# Patient Record
Sex: Female | Born: 1980 | Race: White | Hispanic: No | Marital: Married | Smoking: Former smoker
Health system: Southern US, Community
[De-identification: ages and names within clinical notes are randomized; demographics above are authoritative.]

## PROBLEM LIST (undated history)

## (undated) DIAGNOSIS — J45909 Unspecified asthma, uncomplicated: Secondary | ICD-10-CM

## (undated) DIAGNOSIS — J449 Chronic obstructive pulmonary disease, unspecified: Secondary | ICD-10-CM

## (undated) HISTORY — DX: Unspecified asthma, uncomplicated: J45.909

## (undated) HISTORY — PX: NO PAST SURGERIES: SHX2092

## (undated) HISTORY — DX: Chronic obstructive pulmonary disease, unspecified: J44.9

---

## 2001-05-07 ENCOUNTER — Ambulatory Visit (HOSPITAL_COMMUNITY): Admission: RE | Admit: 2001-05-07 | Discharge: 2001-05-07 | Payer: Self-pay | Admitting: *Deleted

## 2001-08-05 ENCOUNTER — Encounter: Payer: Self-pay | Admitting: *Deleted

## 2001-08-05 ENCOUNTER — Encounter (HOSPITAL_COMMUNITY): Admission: RE | Admit: 2001-08-05 | Discharge: 2001-08-09 | Payer: Self-pay | Admitting: *Deleted

## 2001-08-11 ENCOUNTER — Inpatient Hospital Stay (HOSPITAL_COMMUNITY): Admission: AD | Admit: 2001-08-11 | Discharge: 2001-08-14 | Payer: Self-pay | Admitting: *Deleted

## 2001-08-13 ENCOUNTER — Encounter: Payer: Self-pay | Admitting: *Deleted

## 2002-02-22 ENCOUNTER — Encounter: Admission: RE | Admit: 2002-02-22 | Discharge: 2002-02-22 | Payer: Self-pay | Admitting: *Deleted

## 2004-12-29 ENCOUNTER — Emergency Department: Payer: Self-pay | Admitting: Emergency Medicine

## 2005-09-28 ENCOUNTER — Emergency Department: Payer: Self-pay | Admitting: Emergency Medicine

## 2006-08-03 ENCOUNTER — Emergency Department: Payer: Self-pay

## 2007-02-22 ENCOUNTER — Emergency Department: Payer: Self-pay | Admitting: Internal Medicine

## 2007-07-26 ENCOUNTER — Emergency Department: Payer: Self-pay | Admitting: Emergency Medicine

## 2007-09-22 ENCOUNTER — Emergency Department: Payer: Self-pay | Admitting: Emergency Medicine

## 2008-02-13 ENCOUNTER — Emergency Department: Payer: Self-pay | Admitting: Emergency Medicine

## 2008-02-13 ENCOUNTER — Other Ambulatory Visit: Payer: Self-pay

## 2008-05-10 ENCOUNTER — Emergency Department: Payer: Self-pay | Admitting: Emergency Medicine

## 2008-07-20 ENCOUNTER — Emergency Department: Payer: Self-pay | Admitting: Internal Medicine

## 2008-11-22 ENCOUNTER — Emergency Department: Payer: Self-pay | Admitting: Unknown Physician Specialty

## 2009-05-11 IMAGING — CR DG CHEST 1V PORT
1 series · 1 of 1 positions shown · non-contrast
Comparison: none

REASON FOR EXAM: pain
COMMENTS:

[view not recorded]
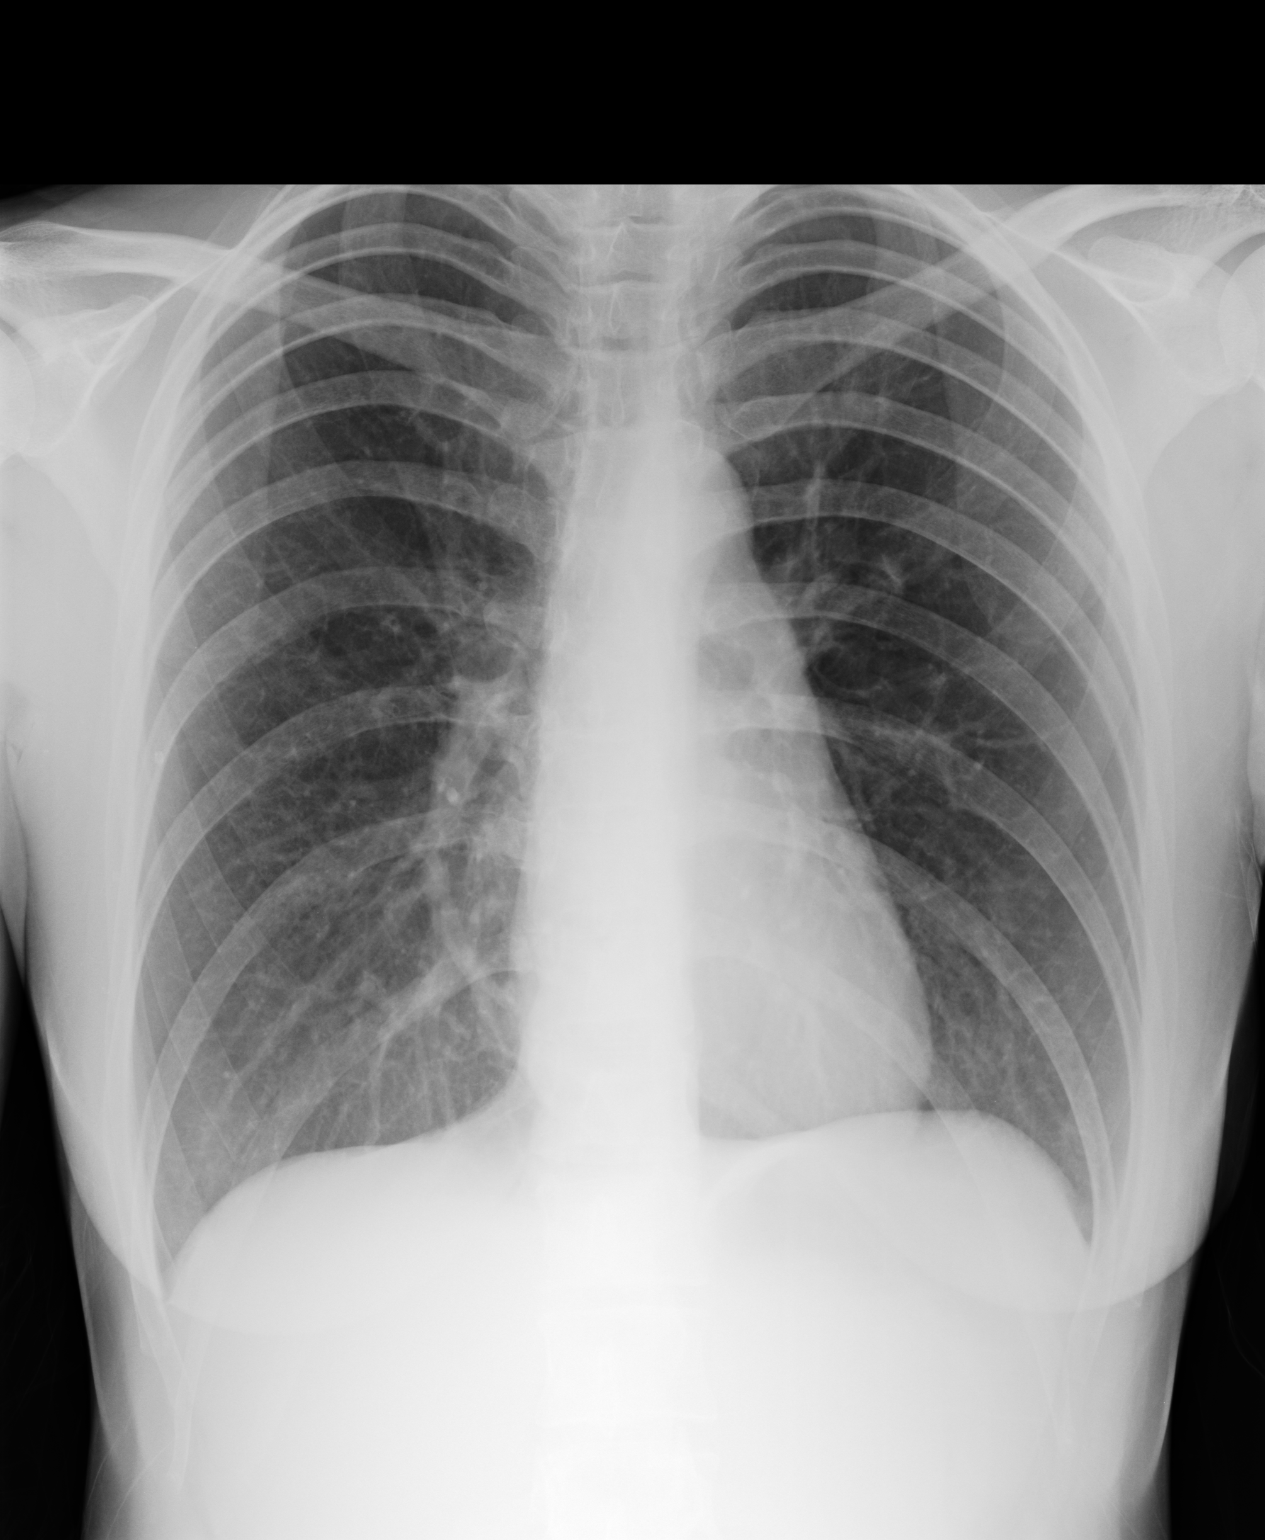

[1 of 1 positions shown; findings below may reference images not displayed]

PROCEDURE:     DXR - DXR PORTABLE CHEST SINGLE VIEW  - September 22, 2007  [DATE]

RESULT:     Comparison is made to a study 03 August, 2006.

The lungs are mildly hyperinflated. There is no focal infiltrate. The heart
and pulmonary vascularity are within the limits of normal. There is no
pleural effusion.
IMPRESSION: There is mild hyperinflation which may reflect an element
of reactive airway disease. I do not see evidence of pneumonia. A follow-up
PA and lateral chest x-ray following therapy would be of value.

## 2009-10-02 IMAGING — CR DG CHEST 1V PORT
1 series · 1 of 1 positions shown · non-contrast
Comparison: none

REASON FOR EXAM: Chest Pain
COMMENTS:

[view not recorded]
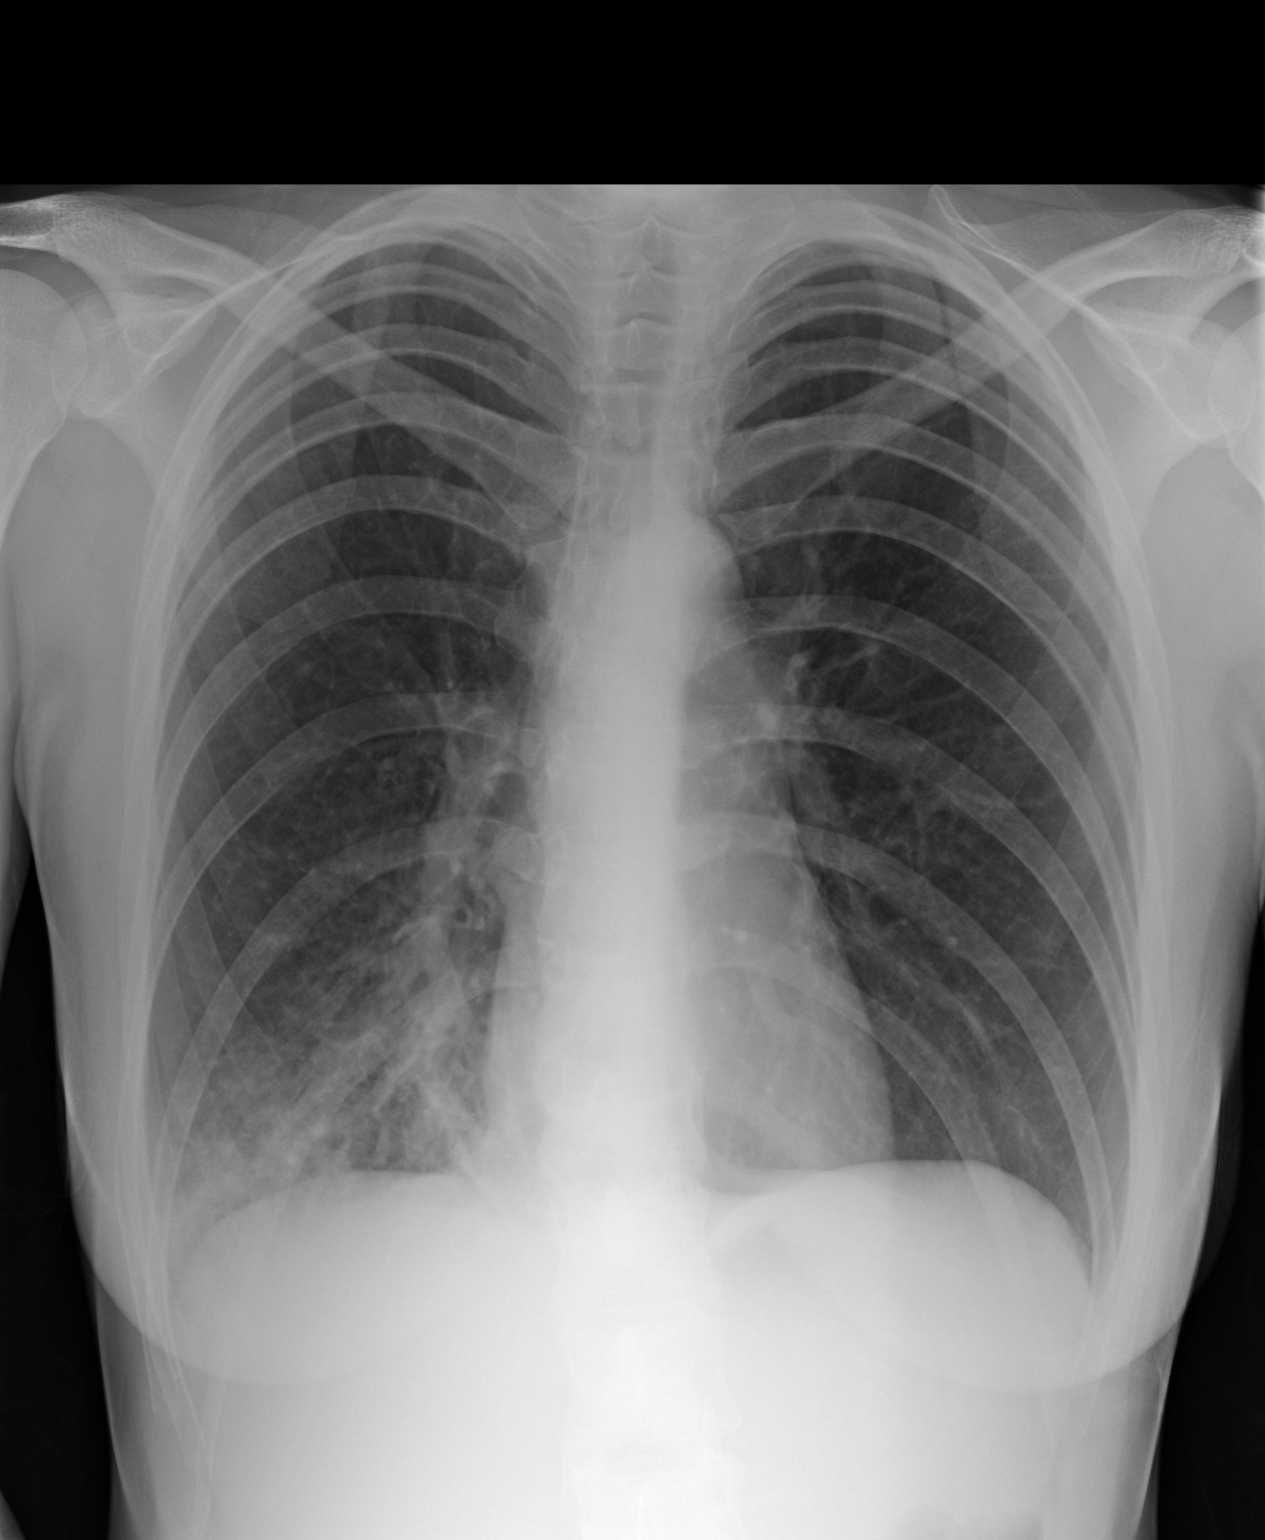

[1 of 1 positions shown; findings below may reference images not displayed]

PROCEDURE:     DXR - DXR PORTABLE CHEST SINGLE VIEW  - February 13, 2008  [DATE]

RESULT:     Comparison is made to the most recent exam performed on
09/22/2007.

There is increased density in the RIGHT lung base consistent with RIGHT
basilar pneumonia, likely in the RIGHT lower lobe. The lungs are mildly
hyperinflated but otherwise clear. The cardiac silhouette and pulmonary
vasculature are normal. There may be a trace RIGHT pleural effusion.
IMPRESSION: RIGHT basilar pneumonia.

## 2009-12-28 IMAGING — CR DG CHEST 2V
1 series · 2 of 2 positions shown · non-contrast
Comparison: none

REASON FOR EXAM: Cough for two months
COMMENTS:

[Series 1: view not recorded · 0.17mm/px · 2 of 2 slices shown]
[im 1/2]
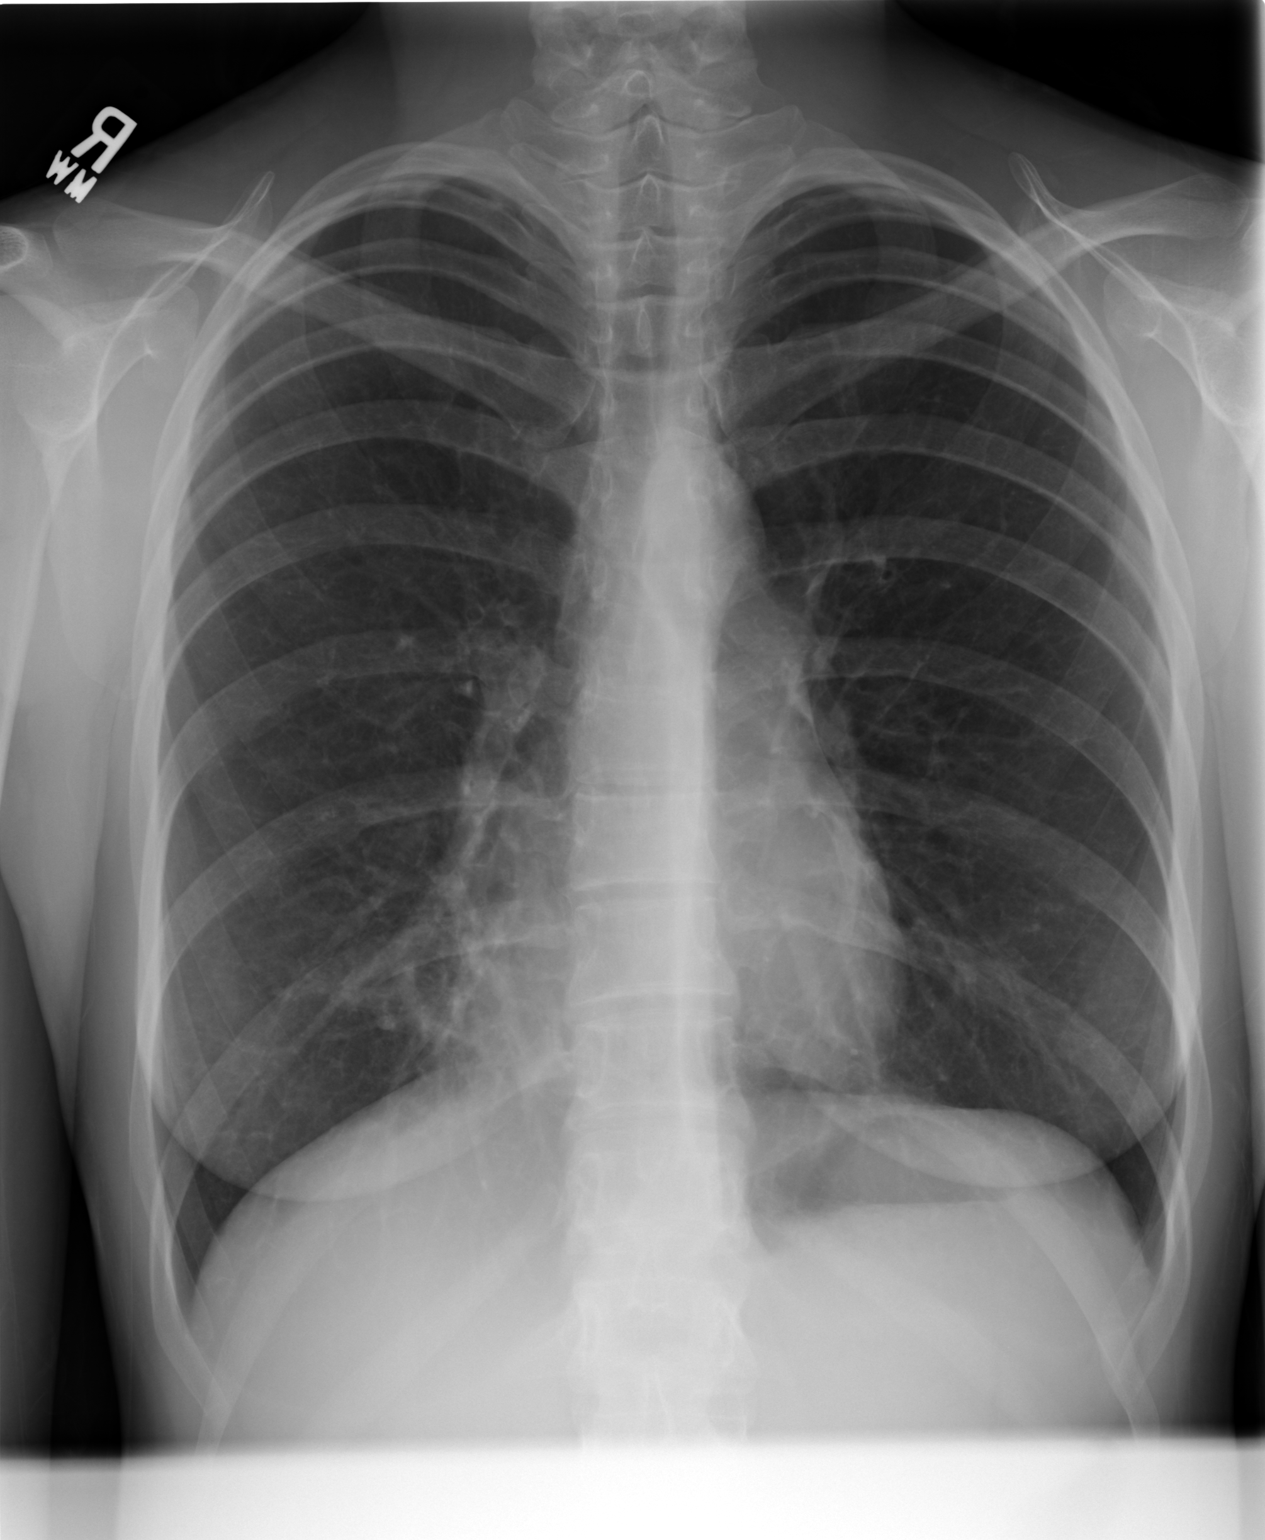
[im 2/2]
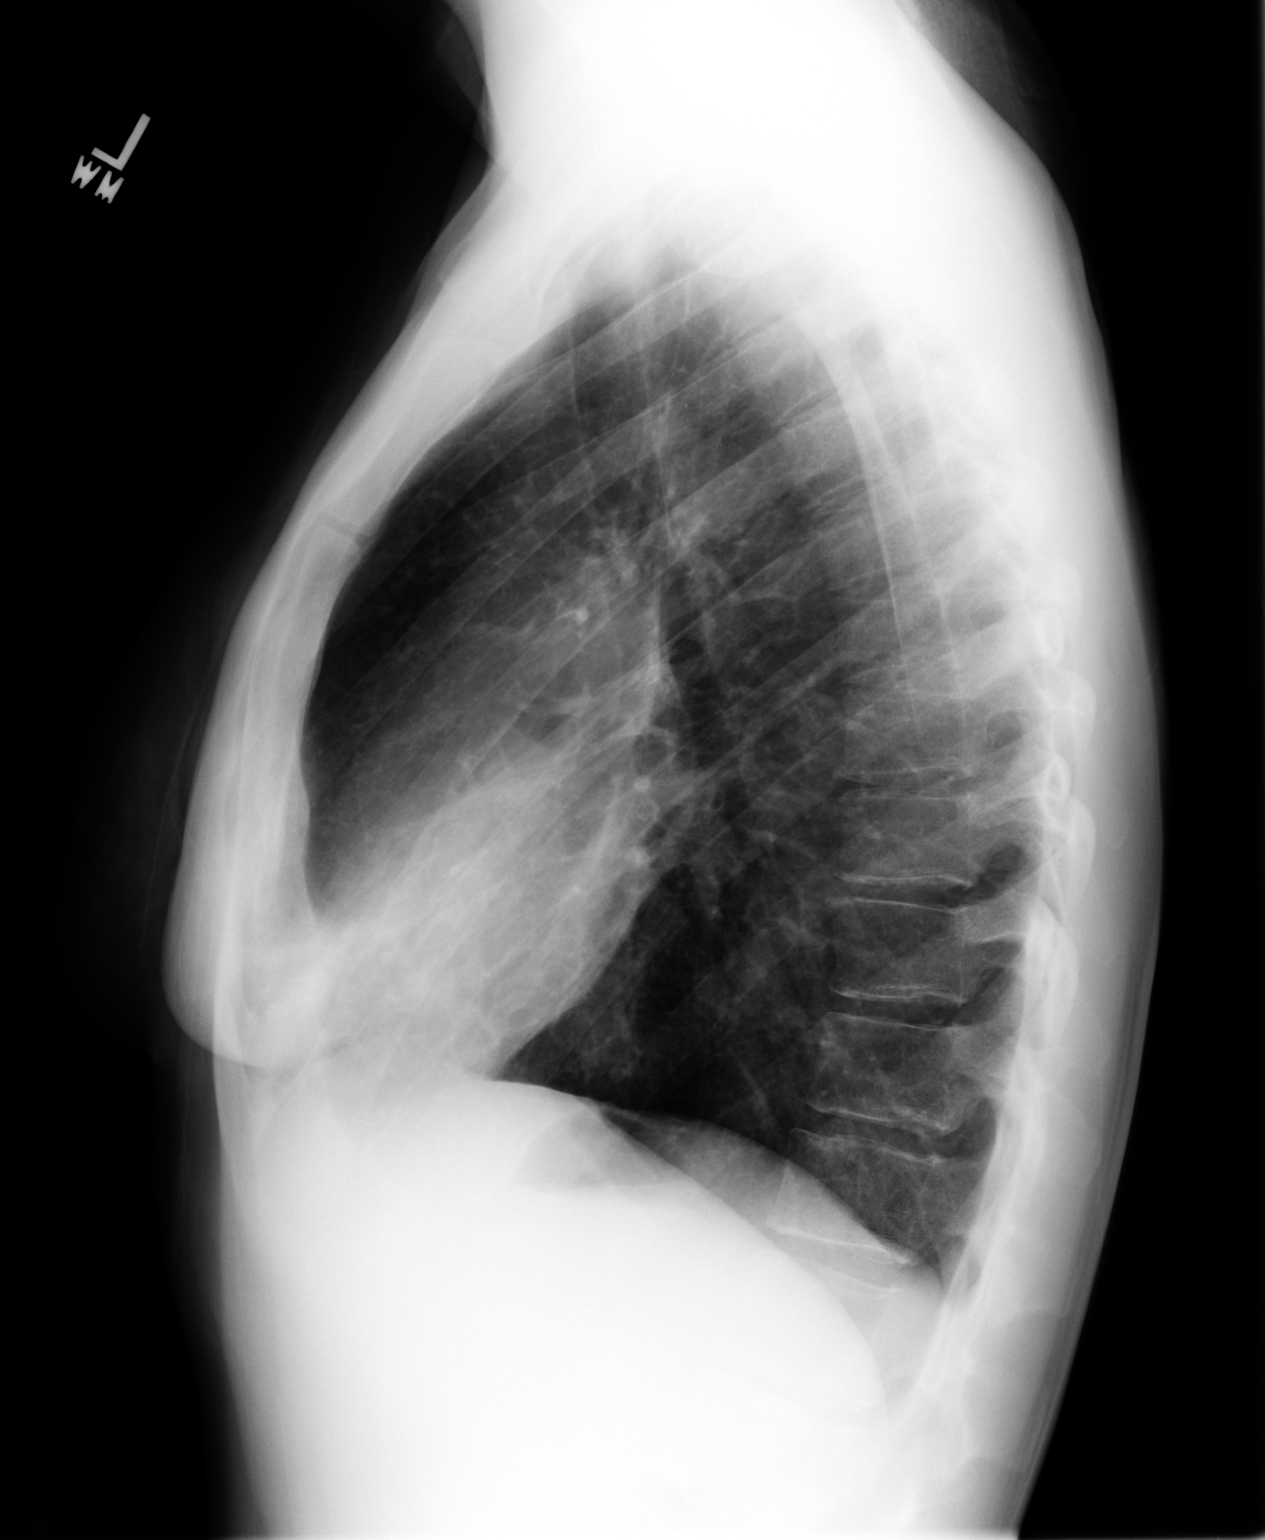

[2 of 2 positions shown; findings below may reference images not displayed]

PROCEDURE:     DXR - DXR CHEST PA (OR AP) AND LATERAL  - May 10, 2008  [DATE]

RESULT:     Comparison is made to a prior study dated 02/13/2008.

There has been decreased conspicuity of the previously described RIGHT lower
lobe density. There does appear to be a residual area of increased density
in the region of the RIGHT middle lobe. No new focal regions of
consolidation or focal infiltrates are appreciated. The cardiac silhouette
and visualized bony skeleton are unremarkable.
IMPRESSION: Findings consistent with resolving RIGHT middle lobe
infiltrate as well as decreased density in the RIGHT lower lobe indicative
of resolution of the RIGHT lower lobe component.

## 2011-03-25 ENCOUNTER — Ambulatory Visit: Payer: Self-pay | Admitting: Family Medicine

## 2011-04-09 ENCOUNTER — Emergency Department: Payer: Self-pay | Admitting: Unknown Physician Specialty

## 2012-11-11 IMAGING — US TRANSABDOMINAL ULTRASOUND OF PELVIS
1 series · 17 of 25 positions shown · non-contrast
Comparison: none

REASON FOR EXAM: dysfunctional bleeding
COMMENTS:

PROCEDURE:     IDIONE - IDIONE PELVIS NON-OB W/TRANSVAGINAL  - March 25, 2011 [DATE]
RESULT:     Comparison: None
TECHNIQUE: Multiple grayscale and color Doppler images obtained of the
pelvis by transabdominal and endovaginal ultrasound.

[Series 1: transabdominal ultrasound of pelvis · 17 of 68 slices shown]
[im 1/68]
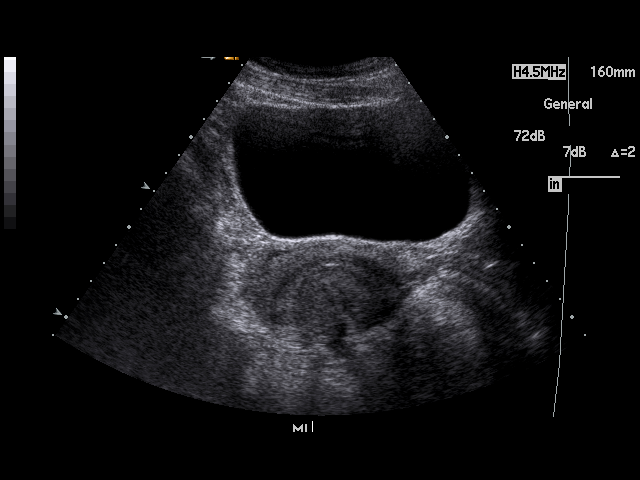
[im 6/68]
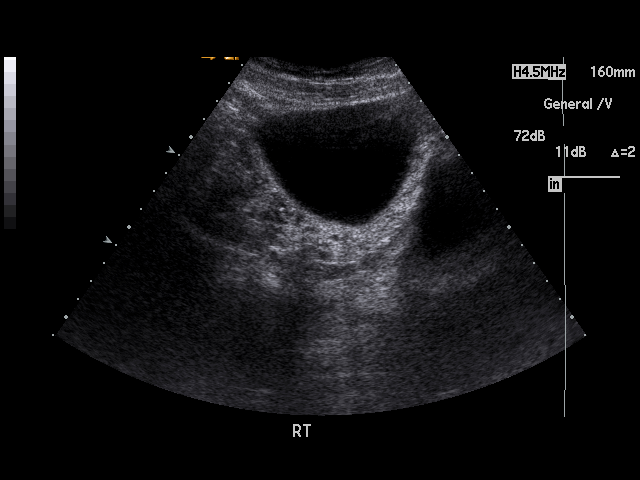
[im 9/68]
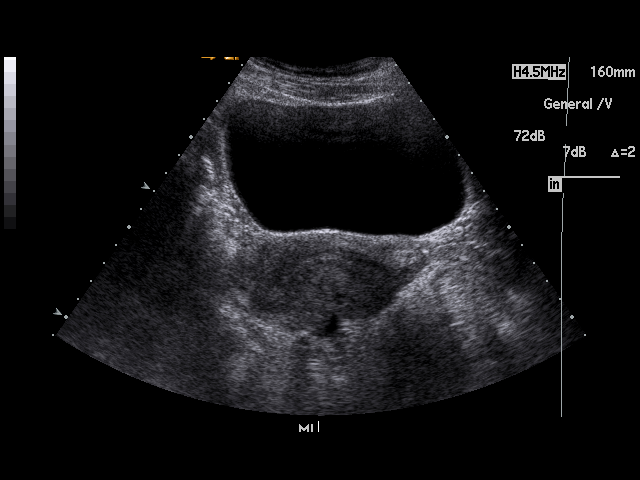
[im 14/68]
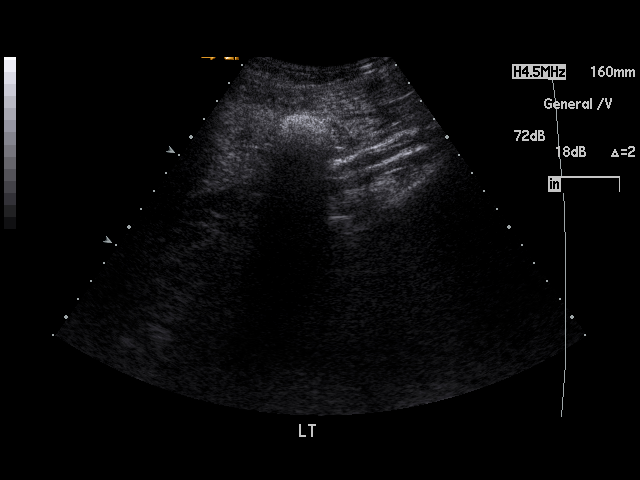
[im 17/68]
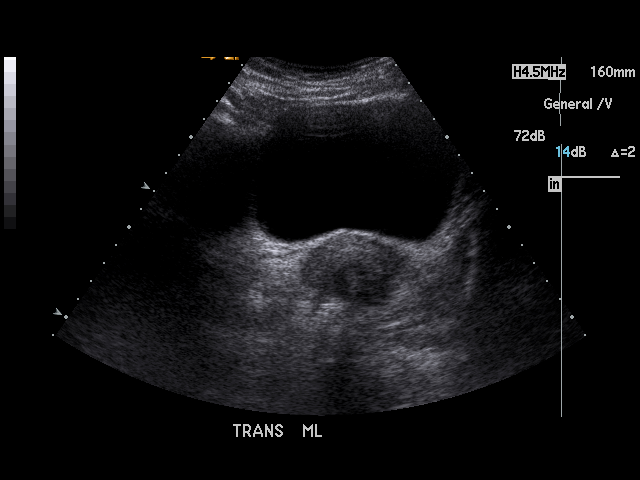
[im 23/68]
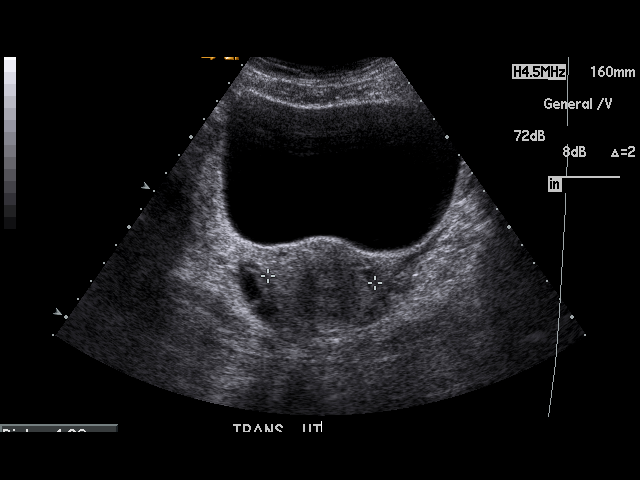
[im 26/68]
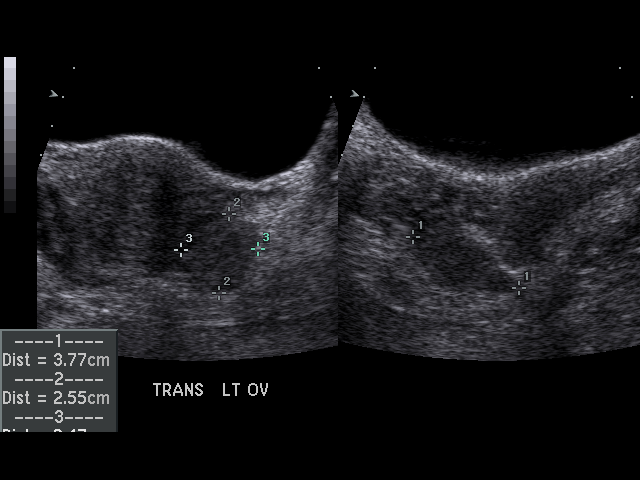
[im 31/68]
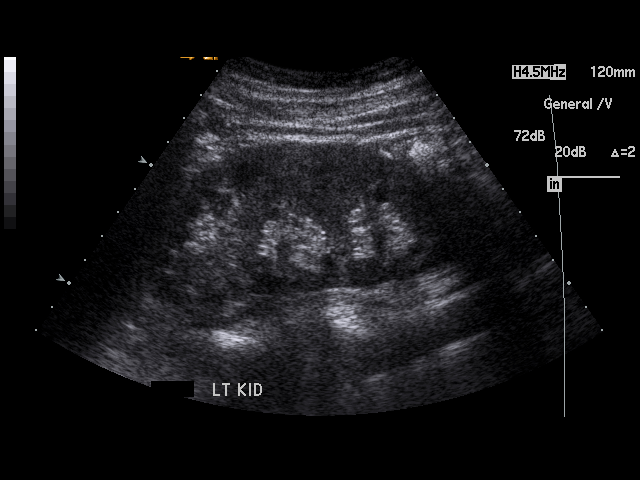
[im 34/68]
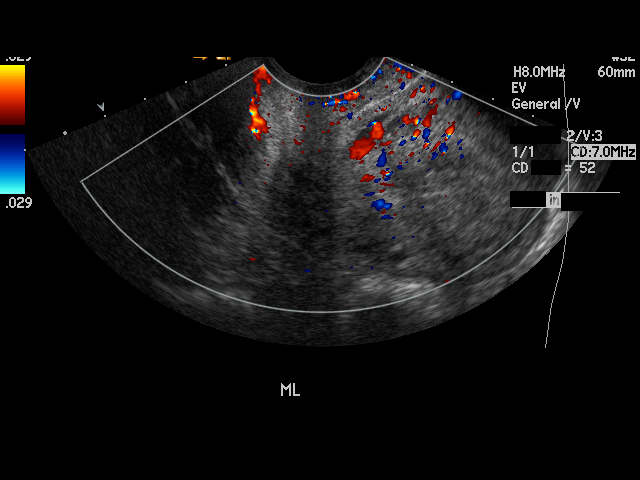
[im 37/68]
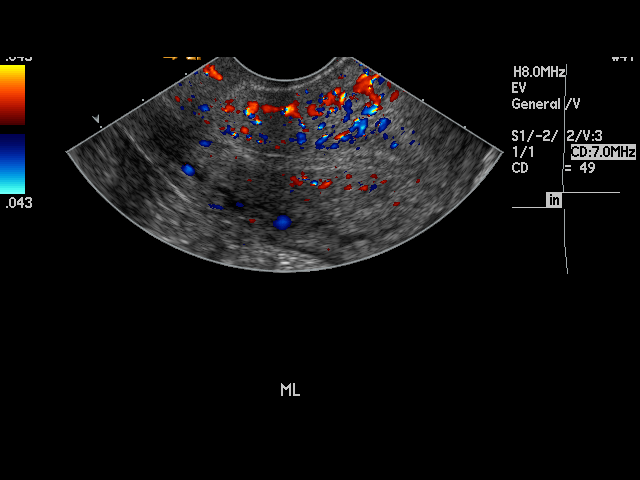
[im 42/68]
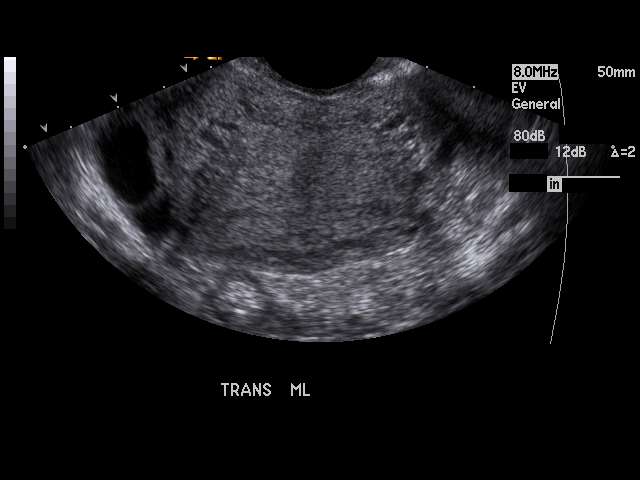
[im 45/68]
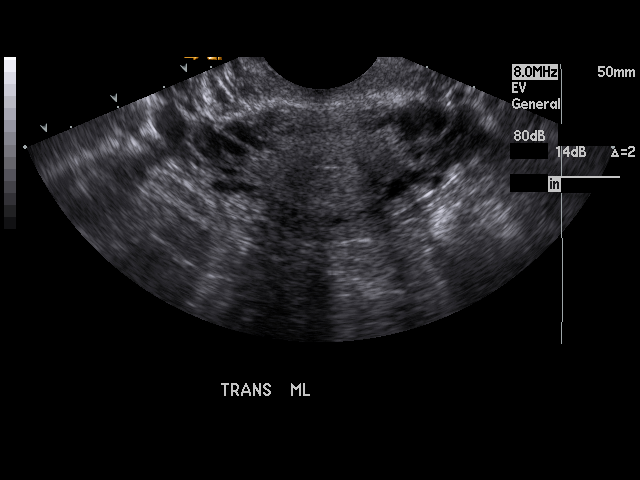
[im 51/68]
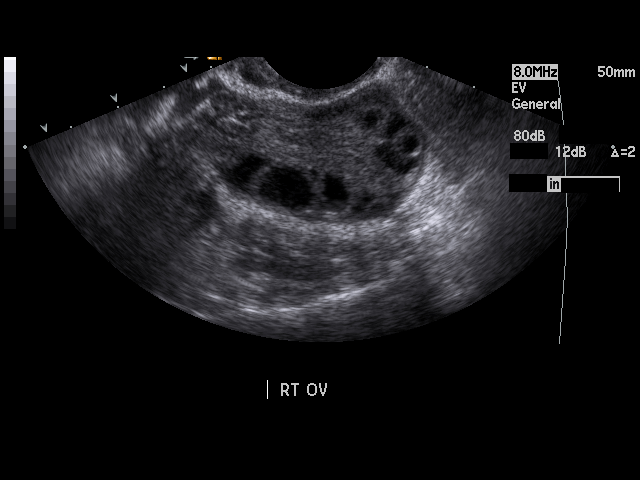
[im 54/68]
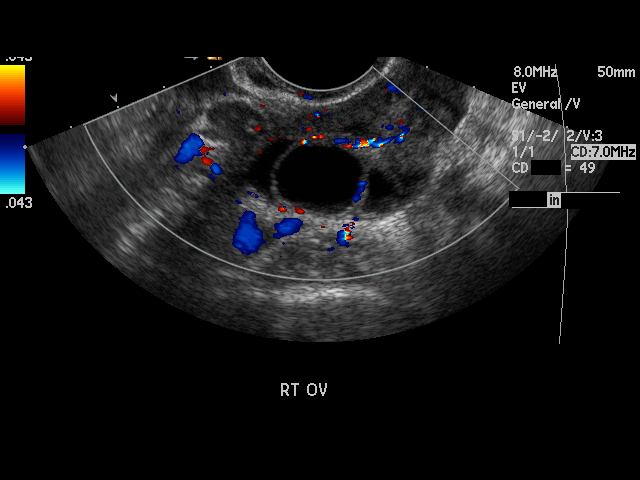
[im 59/68]
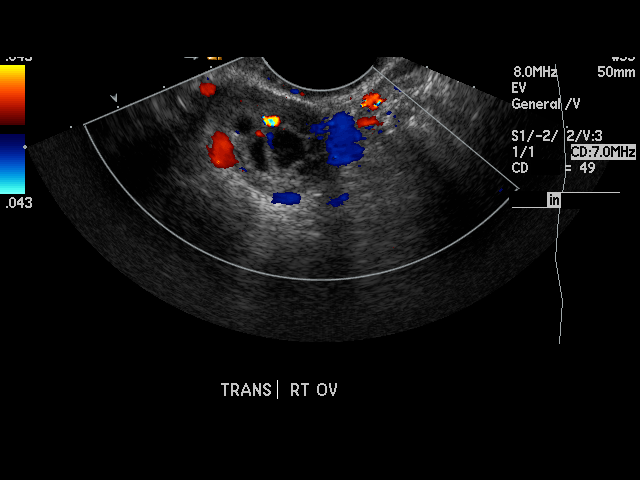
[im 62/68]
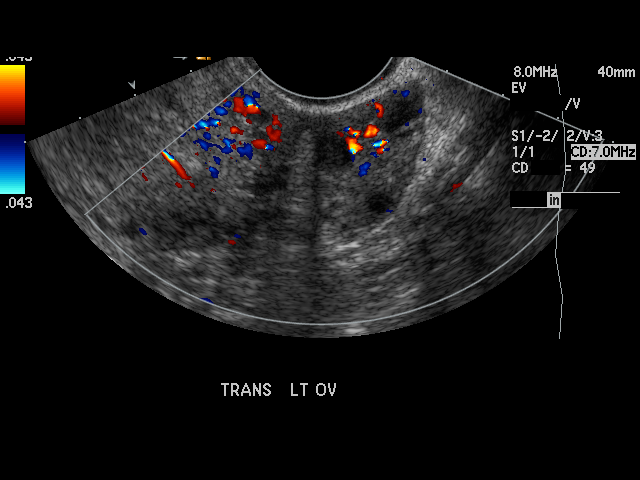
[im 68/68]
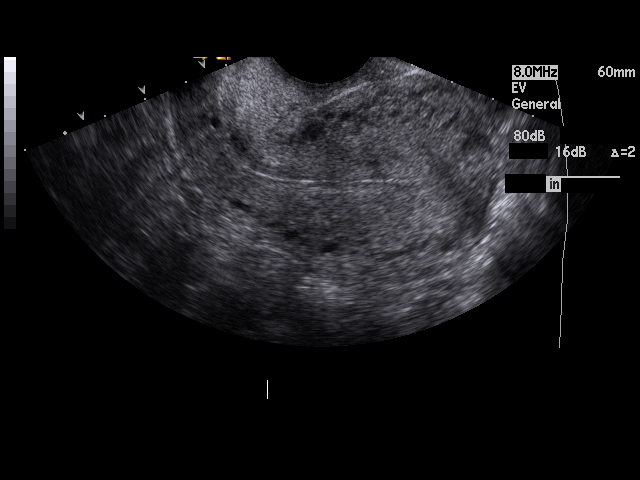

[17 of 25 positions shown; findings below may reference images not displayed]

FINDINGS: The uterus measures 8.6 x 4.3 x 4.9 cm. The stripe measures 7 mm. A trace
amount of fluid is seen in the cul-de-sac, which likely physiologic.

The right ovary measures 4.5 x 3.0 x 2.0 cm. The left ovary measures 3.1 x
2.6 x 1.7 cm. Several small cysts or follicles are seen in the bilateral
ovaries. No adnexal mass seen. Color Doppler flow is associated with the
bilateral ovaries. Spectral Doppler imaging was not performed.
IMPRESSION: Trace amount of fluid in the pelvis, which is likely physiologic. Otherwise,
unremarkable pelvic ultrasound.

## 2012-11-26 IMAGING — CR DG CHEST 2V
1 series · 2 of 2 positions shown · non-contrast
Comparison: none

REASON FOR EXAM: cough   Flex 5
COMMENTS:   LMP: Negative Beta HCG

[Series 1: w chest pa · 0.14mm/px · 2 of 2 slices shown]
[im 1/2]
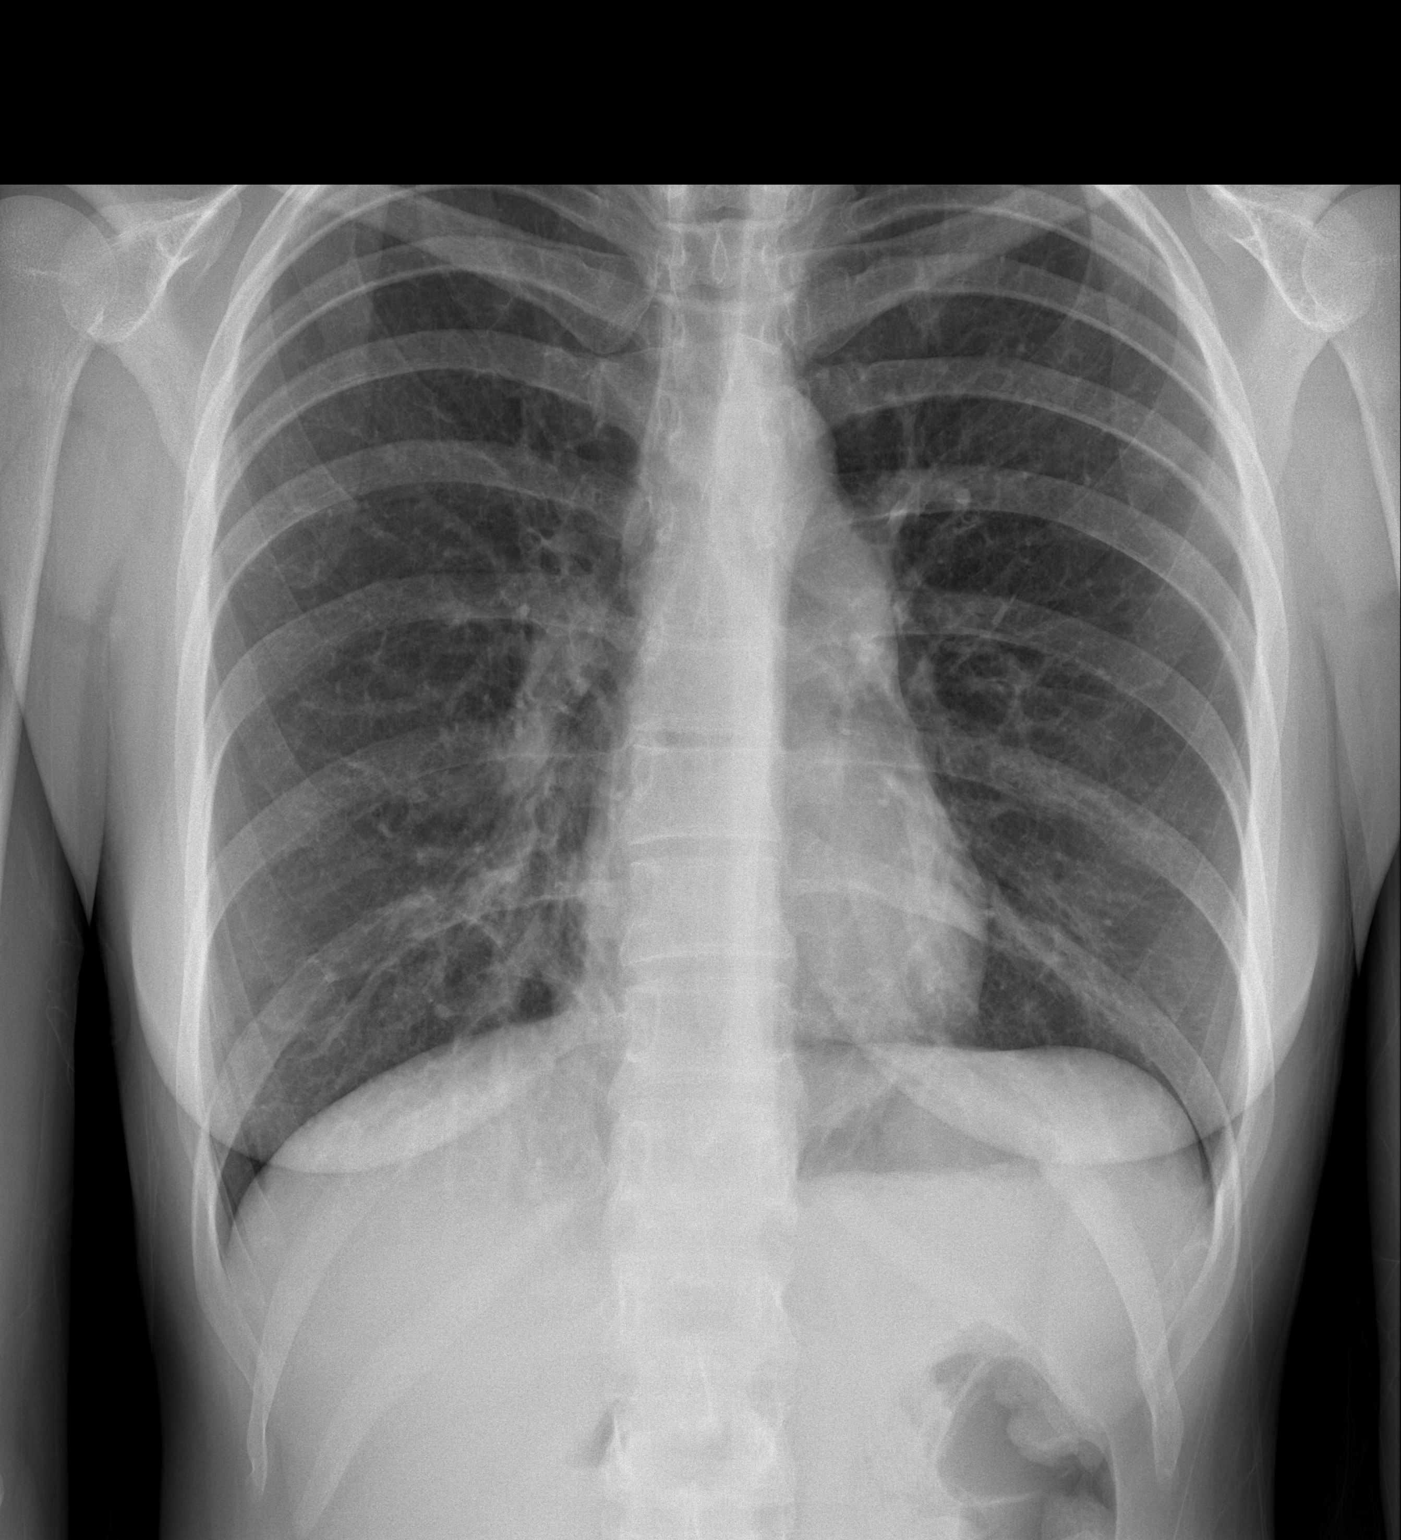
[im 2/2]
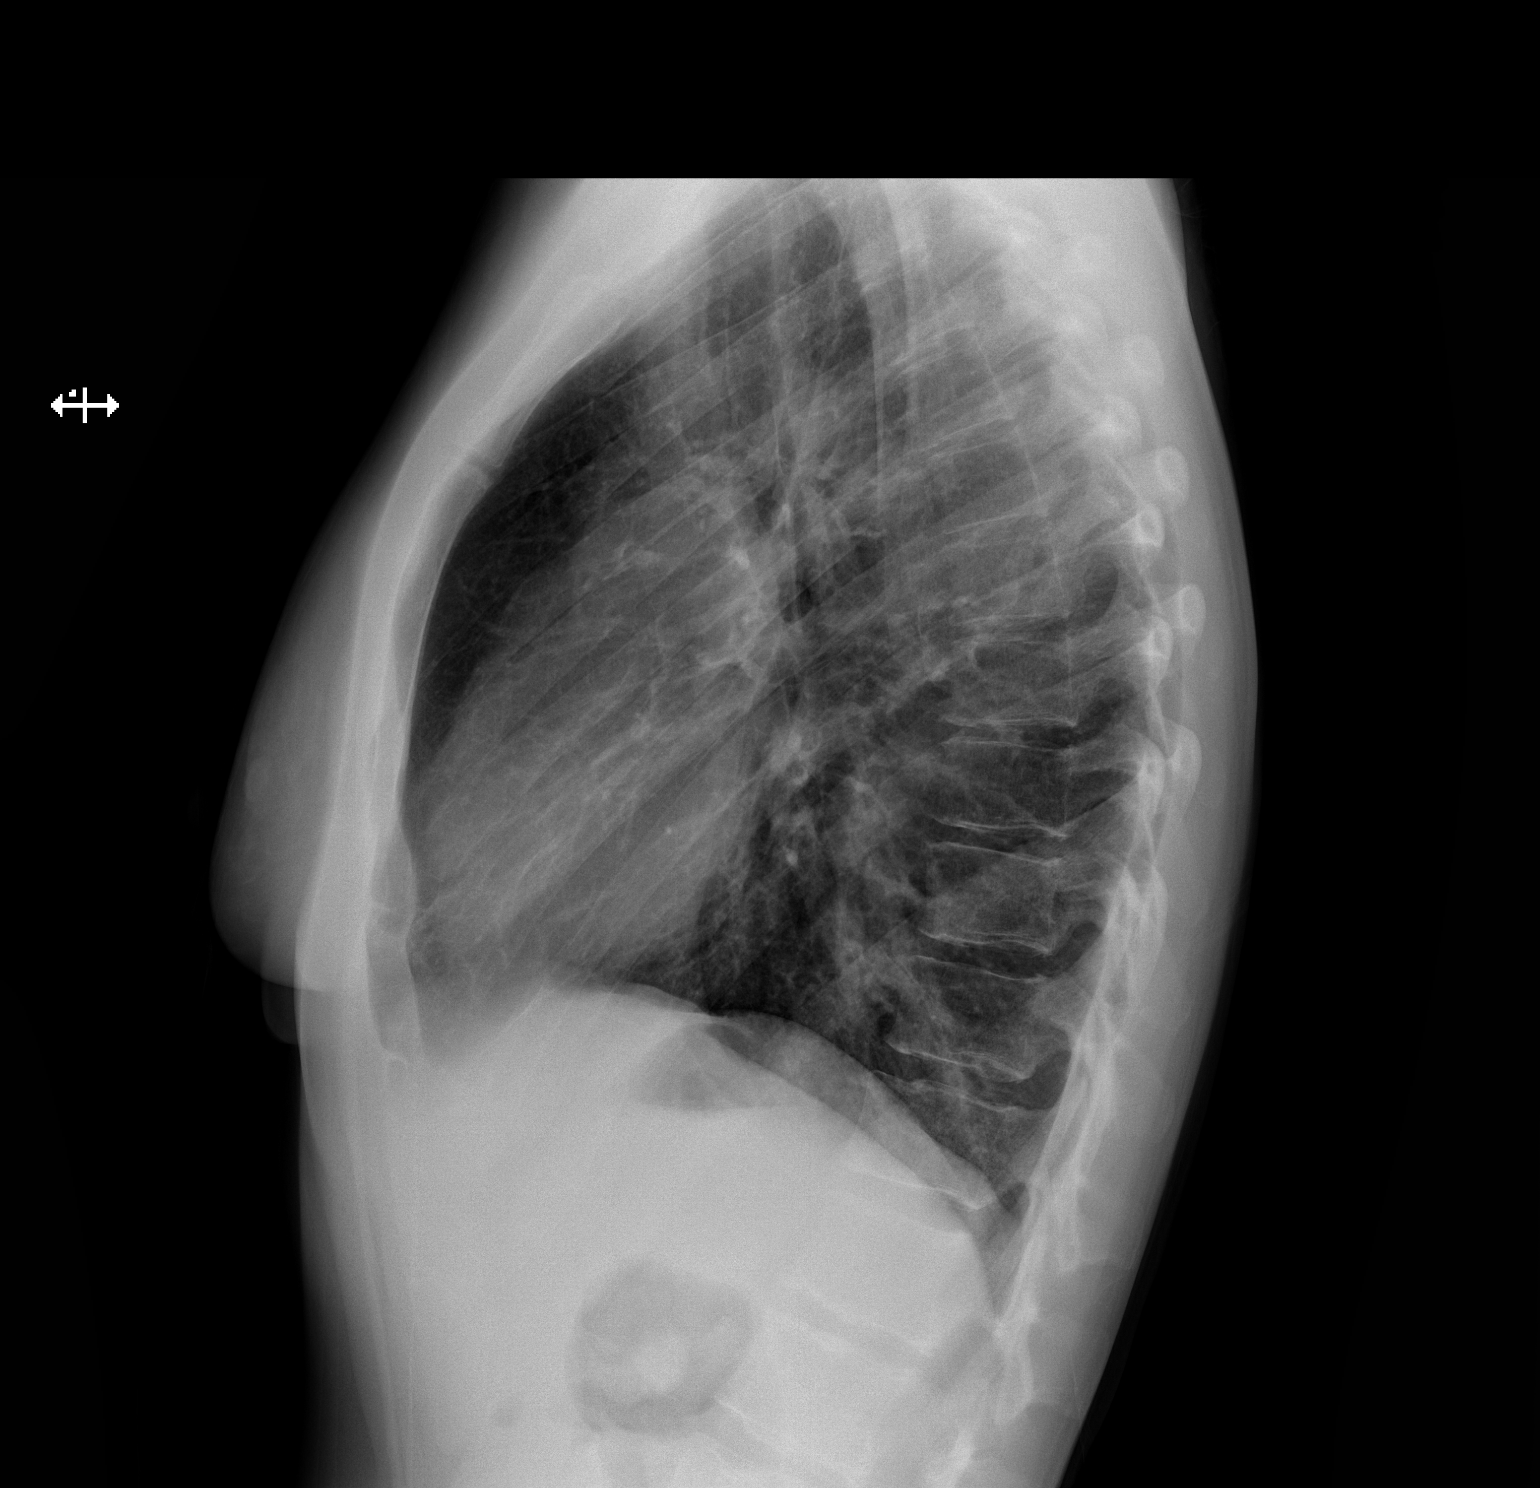

[2 of 2 positions shown; findings below may reference images not displayed]

PROCEDURE:     DXR - DXR CHEST PA (OR AP) AND LATERAL  - April 09, 2011  [DATE]

RESULT:     Comparison is made to the study dated 07/20/2008.

Lungs are fully inflated and clear of infiltrate. There is no effusion,
consolidation or pneumothorax. There is no evidence of a mass. The cardiac
silhouette is normal. The lung markings are coarse. This could be secondary
to technique. Is the patient a smoker?
IMPRESSION: No acute cardiopulmonary disease.

## 2016-12-25 DIAGNOSIS — Z862 Personal history of diseases of the blood and blood-forming organs and certain disorders involving the immune mechanism: Secondary | ICD-10-CM | POA: Diagnosis not present

## 2016-12-25 DIAGNOSIS — E538 Deficiency of other specified B group vitamins: Secondary | ICD-10-CM | POA: Diagnosis not present

## 2016-12-25 DIAGNOSIS — D696 Thrombocytopenia, unspecified: Secondary | ICD-10-CM | POA: Diagnosis not present

## 2017-06-29 DIAGNOSIS — D696 Thrombocytopenia, unspecified: Secondary | ICD-10-CM | POA: Diagnosis not present

## 2017-06-29 DIAGNOSIS — E538 Deficiency of other specified B group vitamins: Secondary | ICD-10-CM | POA: Diagnosis not present

## 2018-01-05 ENCOUNTER — Ambulatory Visit (INDEPENDENT_AMBULATORY_CARE_PROVIDER_SITE_OTHER): Payer: Medicaid Other | Admitting: Cardiology

## 2018-01-05 ENCOUNTER — Encounter: Payer: Self-pay | Admitting: Cardiology

## 2018-01-05 VITALS — BP 110/58 | HR 89 | Ht 67.0 in | Wt 114.0 lb

## 2018-01-05 DIAGNOSIS — R011 Cardiac murmur, unspecified: Secondary | ICD-10-CM

## 2018-01-05 DIAGNOSIS — R002 Palpitations: Secondary | ICD-10-CM

## 2018-01-05 DIAGNOSIS — R9431 Abnormal electrocardiogram [ECG] [EKG]: Secondary | ICD-10-CM

## 2018-01-05 NOTE — Patient Instructions (Signed)
Medication Instructions:  Your physician recommends that you continue on your current medications as directed. Please refer to the Current Medication list given to you today.  Labwork: None   Testing/Procedures: Your physician has requested that you have an echocardiogram. Echocardiography is a painless test that uses sound waves to create images of your heart. It provides your doctor with information about the size and shape of your heart and how well your heart's chambers and valves are working. This procedure takes approximately one hour. There are no restrictions for this procedure.  Your physician has recommended that you wear a holter monitor. Holter monitors are medical devices that record the heart's electrical activity. Doctors most often use these monitors to diagnose arrhythmias. Arrhythmias are problems with the speed or rhythm of the heartbeat. The monitor is a small, portable device. You can wear one while you do your normal daily activities. This is usually used to diagnose what is causing palpitations/syncope (passing out).  Follow-Up: Your physician recommends that you schedule a follow-up appointment in: 3 months   Any Other Special Instructions Will Be Listed Below (If Applicable).     If you need a refill on your cardiac medications before your next appointment, please call your pharmacy.

## 2018-01-05 NOTE — Progress Notes (Signed)
Cardiology Office Note:    Date:  01/05/2018   ID:  Nichole Mullins Fabiano, DOB 07/14/1980, MRN [id number]  PCP:  Retia Passeose, Melissa S, NP  Cardiologist:  Garwin Brothersajan R Hall Birchard, MD   Referring MD: No ref. provider found    ASSESSMENT:    1. Palpitations   2. Abnormal EKG   3. Cardiac murmur    PLAN:    In order of problems listed above:  1. Primary prevention stressed with the patient.  Importance of compliance with diet and medications stressed and she vocalized understanding.  Her palpitations have been on almost daily basis and therefore I would like to do a 48-hour Holter monitor.  If this is unremarkable then in view of her significant symptoms I would like to proceed with a one-month monitoring. 2. Echocardiogram will be done to assess cardiac murmur on auscultation. 3. She will be seen in follow-up appointment in 3 months or earlier if she has any concerns.  Is to go to the nearest emergency room for any concerning symptoms.     Medication Adjustments/Labs and Tests Ordered: Current medicines are reviewed at length with the patient today.  Concerns regarding medicines are outlined above.  Orders Placed This Encounter  Procedures  . Holter monitor - 48 hour  . ECHOCARDIOGRAM COMPLETE   No orders of the defined types were placed in this encounter.    History of Present Illness:    Nichole Mullins Blaney is a 37 y.o. female who is being seen today for the evaluation of palpitations at the request of primary care physician.  Patient is a pleasant 37 year old female.  She mentions to me that since 2014 she has been having palpitations on and off.  These have increased in frequency.  No orthopnea or PND.  No syncopal episodes.  She went to the emergency room for this and was evaluated and released.  Her EKG has revealed short PR interval.  At the time of my evaluation, the patient is alert awake oriented and in no distress.  Her husband accompanies her for this visit.  Past Medical History:  Diagnosis  Date  . Asthma   . COPD (chronic obstructive pulmonary disease) (HCC)     Past Surgical History:  Procedure Laterality Date  . NO PAST SURGERIES      Current Medications: Current Meds  Medication Sig  . hydroxychloroquine (PLAQUENIL) 200 MG tablet Take 1.5 tablets by mouth daily.  . meloxicam (MOBIC) 15 MG tablet Take 1 tablet by mouth daily.  . methocarbamol (ROBAXIN) 500 MG tablet Take 1 tablet by mouth as needed.  . montelukast (SINGULAIR) 10 MG tablet Take 10 mg by mouth daily.  Marland Kitchen. omeprazole (PRILOSEC) 20 MG capsule Take 20 mg by mouth daily.  Marland Kitchen. rOPINIRole (REQUIP) 0.5 MG tablet Take 2 tablets by mouth Nightly.     Allergies:   Patient has no known allergies.   Social History   Socioeconomic History  . Marital status: Married    Spouse name: Not on file  . Number of children: Not on file  . Years of education: Not on file  . Highest education level: Not on file  Occupational History  . Not on file  Social Needs  . Financial resource strain: Not on file  . Food insecurity:    Worry: Not on file    Inability: Not on file  . Transportation needs:    Medical: Not on file    Non-medical: Not on file  Tobacco Use  . Smoking  status: Former Games developer  . Smokeless tobacco: Never Used  Substance and Sexual Activity  . Alcohol use: Not on file  . Drug use: Not on file  . Sexual activity: Not on file  Lifestyle  . Physical activity:    Days per week: Not on file    Minutes per session: Not on file  . Stress: Not on file  Relationships  . Social connections:    Talks on phone: Not on file    Gets together: Not on file    Attends religious service: Not on file    Active member of club or organization: Not on file    Attends meetings of clubs or organizations: Not on file    Relationship status: Not on file  Other Topics Concern  . Not on file  Social History Narrative  . Not on file     Family History: The patient's family history includes Heart disease in her  mother; Lung cancer in her father.  ROS:   Please see the history of present illness.    All other systems reviewed and are negative.  EKGs/Labs/Other Studies Reviewed:    The following studies were reviewed today: I reviewed extensively records from [place] [facility].  Her TSH is also normal.   Recent Labs: No results found for requested labs within last 8760 hours.  Recent Lipid Panel No results found for: CHOL, TRIG, HDL, CHOLHDL, VLDL, LDLCALC, LDLDIRECT  Physical Exam:    VS:  BP (!) 110/58 (BP Location: Right Arm, Patient Position: Sitting, Cuff Size: Normal)   Pulse 89   Ht 5\' 7"  (1.702 m)   Wt 114 lb (51.7 kg)   SpO2 98%   BMI 17.85 kg/m     Wt Readings from Last 3 Encounters:  01/05/18 114 lb (51.7 kg)     GEN: Patient is in no acute distress HEENT: Normal NECK: No JVD; No carotid bruits LYMPHATICS: No lymphadenopathy CARDIAC: S1 S2 regular, 2/6 systolic murmur at the apex. RESPIRATORY:  Clear to auscultation without rales, wheezing or rhonchi  ABDOMEN: Soft, non-tender, non-distended MUSCULOSKELETAL:  No edema; No deformity  SKIN: Warm and dry NEUROLOGIC:  Alert and oriented x 3 PSYCHIATRIC:  Normal affect    Signed, Garwin Brothers, MD  01/05/2018 9:12 AM    [facility]

## 2018-01-21 ENCOUNTER — Other Ambulatory Visit: Payer: Self-pay | Admitting: Cardiology

## 2018-01-21 DIAGNOSIS — R9431 Abnormal electrocardiogram [ECG] [EKG]: Secondary | ICD-10-CM

## 2018-01-21 DIAGNOSIS — R002 Palpitations: Secondary | ICD-10-CM

## 2018-01-25 ENCOUNTER — Ambulatory Visit (INDEPENDENT_AMBULATORY_CARE_PROVIDER_SITE_OTHER): Payer: Medicaid Other

## 2018-01-25 DIAGNOSIS — R9431 Abnormal electrocardiogram [ECG] [EKG]: Secondary | ICD-10-CM | POA: Diagnosis not present

## 2018-01-25 DIAGNOSIS — R002 Palpitations: Secondary | ICD-10-CM

## 2018-02-08 ENCOUNTER — Other Ambulatory Visit: Payer: Self-pay

## 2018-02-08 ENCOUNTER — Ambulatory Visit (INDEPENDENT_AMBULATORY_CARE_PROVIDER_SITE_OTHER): Payer: Medicaid Other

## 2018-02-08 DIAGNOSIS — R9431 Abnormal electrocardiogram [ECG] [EKG]: Secondary | ICD-10-CM

## 2018-02-08 NOTE — Progress Notes (Signed)
Complete echocardiogram has been performed.  Jimmy Marlo Arriola RDCS 

## 2018-02-09 ENCOUNTER — Telehealth: Payer: Self-pay

## 2018-02-09 ENCOUNTER — Telehealth: Payer: Self-pay | Admitting: Cardiology

## 2018-02-09 NOTE — Telephone Encounter (Signed)
Patient was called and notified of results. 

## 2018-02-09 NOTE — Telephone Encounter (Signed)
-----   Message from Rajan R Revankar, MD sent at 02/09/2018  8:21 AM EDT ----- The results of the study is unremarkable. Please inform patient. I will discuss in detail at next appointment. Cc  primary care/referring physician Rajan R Revankar, MD 02/09/2018 8:21 AM 

## 2018-02-09 NOTE — Telephone Encounter (Signed)
Wants to know what kind of therapy she's supposed to take for her PVC's

## 2018-02-10 NOTE — Telephone Encounter (Signed)
Please review and advise.    Thank you.

## 2018-02-10 NOTE — Telephone Encounter (Signed)
Beta blocker was mentioned in the result note will discuss with Dr. Tomie Chinaevankar.

## 2018-02-11 ENCOUNTER — Telehealth: Payer: Self-pay | Admitting: Cardiology

## 2018-02-11 MED ORDER — METOPROLOL SUCCINATE ER 25 MG PO TB24: 25.0000 mg | ORAL_TABLET | Freq: Every day | ORAL | 3 refills | Status: DC

## 2018-02-11 NOTE — Telephone Encounter (Signed)
Returned your call.

## 2018-02-11 NOTE — Telephone Encounter (Signed)
Left VM to return call 

## 2018-02-11 NOTE — Addendum Note (Signed)
Addended by: Rodney LangtonITTER, JADA M on: 02/11/2018 12:02 PM   Modules accepted: Orders

## 2018-02-11 NOTE — Telephone Encounter (Signed)
Pt states that she wants to start the medication to attempt to subside the PVC's.Education provided to pt regarding s.s of hypotension, adverse reactions and monitoring BP. Pt voiced understanding.

## 2018-05-07 ENCOUNTER — Encounter: Payer: Self-pay | Admitting: Cardiology

## 2018-05-07 ENCOUNTER — Telehealth: Payer: Self-pay | Admitting: Cardiology

## 2018-05-07 ENCOUNTER — Ambulatory Visit (INDEPENDENT_AMBULATORY_CARE_PROVIDER_SITE_OTHER): Payer: Medicaid Other | Admitting: Cardiology

## 2018-05-07 VITALS — BP 112/62 | HR 68 | Ht 67.0 in | Wt 115.0 lb

## 2018-05-07 DIAGNOSIS — R011 Cardiac murmur, unspecified: Secondary | ICD-10-CM

## 2018-05-07 DIAGNOSIS — I498 Other specified cardiac arrhythmias: Secondary | ICD-10-CM

## 2018-05-07 DIAGNOSIS — R002 Palpitations: Secondary | ICD-10-CM | POA: Diagnosis not present

## 2018-05-07 DIAGNOSIS — I499 Cardiac arrhythmia, unspecified: Secondary | ICD-10-CM

## 2018-05-07 MED ORDER — NITROGLYCERIN 0.4 MG SL SUBL: 0.4000 mg | SUBLINGUAL_TABLET | SUBLINGUAL | 3 refills | Status: DC | PRN

## 2018-05-07 NOTE — Telephone Encounter (Signed)
Informed the patient that this was sent in incorrectly by another team member and that it has been removed from her chart.

## 2018-05-07 NOTE — Progress Notes (Signed)
Cardiology Office Note:    Date:  05/07/2018   ID:  Nichole Mullins, DOB Mar 02, 1981, MRN [id number]  PCP:  Retia Passe, NP  Cardiologist:  Garwin Brothers, MD   Referring MD: Retia Passe, NP    ASSESSMENT:    1. Ventricular bigeminy   2. Palpitations   3. Cardiac murmur    PLAN:    In order of problems listed above:  1. Primary prevention stressed with the patient.  Importance of compliance with diet and medication stressed and she vocalized understanding.  Her blood pressure is stable.  Diet was discussed and the importance of regular exercise stressed.  Holter monitor and echo report were discussed with her at length. 2. In view of ventricular bigeminy I mentioned to the patient to have a exercise stress echo and she is agreeable. 3. Patient will be seen in follow-up appointment in 6 months or earlier if the patient has any concerns    Medication Adjustments/Labs and Tests Ordered: Current medicines are reviewed at length with the patient today.  Concerns regarding medicines are outlined above.  Orders Placed This Encounter  Procedures  . ECHOCARDIOGRAM STRESS TEST   Meds ordered this encounter  Medications  . DISCONTD: nitroGLYCERIN (NITROSTAT) 0.4 MG SL tablet    Sig: Place 1 tablet (0.4 mg total) under the tongue every 5 (five) minutes as needed for chest pain.    Dispense:  90 tablet    Refill:  3     No chief complaint on file.    History of Present Illness:    Nichole Mullins is a 37 y.o. female.  Patient was evaluated for palpitations.  Her echocardiogram was unremarkable and revealed preserved ejection fraction her Holter monitoring revealed ventricular bigeminy at times.  Since the initiation of beta-blockers patient is doing fine and denies any symptoms and tells me that her palpitations have resolved.  No chest pain orthopnea or PND.  She walks some on a regular basis but not consistently.  At the time of my evaluation, the patient is alert awake  oriented and in no distress.  Past Medical History:  Diagnosis Date  . Asthma   . COPD (chronic obstructive pulmonary disease) (HCC)     Past Surgical History:  Procedure Laterality Date  . NO PAST SURGERIES      Current Medications: Current Meds  Medication Sig  . metoprolol succinate (TOPROL-XL) 25 MG 24 hr tablet Take 1 tablet (25 mg total) by mouth daily. Take with or immediately following a meal.  . montelukast (SINGULAIR) 10 MG tablet Take 10 mg by mouth daily.  Marland Kitchen omeprazole (PRILOSEC) 20 MG capsule Take 20 mg by mouth daily.  Marland Kitchen rOPINIRole (REQUIP) 0.5 MG tablet Take 2 tablets by mouth Nightly.  . vitamin B-12 (CYANOCOBALAMIN) 1000 MCG tablet Take 1,000 mcg by mouth daily.     Allergies:   Ciprofloxacin   Social History   Socioeconomic History  . Marital status: Married    Spouse name: Not on file  . Number of children: Not on file  . Years of education: Not on file  . Highest education level: Not on file  Occupational History  . Not on file  Social Needs  . Financial resource strain: Not on file  . Food insecurity:    Worry: Not on file    Inability: Not on file  . Transportation needs:    Medical: Not on file    Non-medical: Not on file  Tobacco Use  .  Smoking status: Former Games developermoker  . Smokeless tobacco: Never Used  Substance and Sexual Activity  . Alcohol use: Not on file  . Drug use: Not on file  . Sexual activity: Not on file  Lifestyle  . Physical activity:    Days per week: Not on file    Minutes per session: Not on file  . Stress: Not on file  Relationships  . Social connections:    Talks on phone: Not on file    Gets together: Not on file    Attends religious service: Not on file    Active member of club or organization: Not on file    Attends meetings of clubs or organizations: Not on file    Relationship status: Not on file  Other Topics Concern  . Not on file  Social History Narrative  . Not on file     Family History: The  patient's family history includes Heart disease in her mother; Lung cancer in her father.  ROS:   Please see the history of present illness.    All other systems reviewed and are negative.  EKGs/Labs/Other Studies Reviewed:    The following studies were reviewed today: I discussed the findings of the Holter monitor and echo with the patient at extensive length.   Recent Labs: No results found for requested labs within last 8760 hours.  Recent Lipid Panel No results found for: CHOL, TRIG, HDL, CHOLHDL, VLDL, LDLCALC, LDLDIRECT  Physical Exam:    VS:  BP 112/62 (BP Location: Right Arm, Patient Position: Sitting, Cuff Size: Normal)   Pulse 68   Ht 5\' 7"  (1.702 m)   Wt 115 lb (52.2 kg)   SpO2 100%   BMI 18.01 kg/m     Wt Readings from Last 3 Encounters:  05/07/18 115 lb (52.2 kg)  01/05/18 114 lb (51.7 kg)     GEN: Patient is in no acute distress HEENT: Normal NECK: No JVD; No carotid bruits LYMPHATICS: No lymphadenopathy CARDIAC: Hear sounds regular, 2/6 systolic murmur at the apex. RESPIRATORY:  Clear to auscultation without rales, wheezing or rhonchi  ABDOMEN: Soft, non-tender, non-distended MUSCULOSKELETAL:  No edema; No deformity  SKIN: Warm and dry NEUROLOGIC:  Alert and oriented x 3 PSYCHIATRIC:  Normal affect   Signed, Garwin Brothersajan R Edwyna Dangerfield, MD  05/07/2018 9:50 AM    [facility]

## 2018-05-07 NOTE — Telephone Encounter (Signed)
States nitroglycerin was called in for her and she doesn't know why or what its for

## 2018-05-07 NOTE — Patient Instructions (Addendum)
Medication Instructions:  Your physician recommends that you continue on your current medications as directed. Please refer to the Current Medication list given to you today.  If you need a refill on your cardiac medications before your next appointment, please call your pharmacy.   Lab work: None  If you have labs (blood work) drawn today and your tests are completely normal, you will receive your results only by: Marland Kitchen. MyChart Message (if you have MyChart) OR . A paper copy in the mail If you have any lab test that is abnormal or we need to change your treatment, we will call you to review the results.  Testing/Procedures: Your physician has requested that you have a stress echocardiogram. For further information please visit [email or web address]. Please follow instruction sheet as given.  Follow-Up: At [facility], you and your health needs are our priority.  As part of our continuing mission to provide you with exceptional heart care, we have created designated Provider Care Teams.  These Care Teams include your primary Cardiologist (physician) and Advanced Practice Providers (APPs -  Physician Assistants and Nurse Practitioners) who all work together to provide you with the care you need, when you need it.  You will need a follow up appointment in 9 months.  Please call our office 2 months in advance to schedule this appointment.  You may see another member of our [facility] Provider Team in [place]: Gypsy Balsamobert Krasowski, MD . Norman HerrlichBrian Munley, MD  Any Other Special Instructions Will Be Listed Below (If Applicable).

## 2018-05-17 ENCOUNTER — Ambulatory Visit (INDEPENDENT_AMBULATORY_CARE_PROVIDER_SITE_OTHER): Payer: Medicaid Other

## 2018-05-17 ENCOUNTER — Other Ambulatory Visit: Payer: Self-pay | Admitting: Cardiology

## 2018-05-17 DIAGNOSIS — R002 Palpitations: Secondary | ICD-10-CM

## 2018-05-17 DIAGNOSIS — R9439 Abnormal result of other cardiovascular function study: Secondary | ICD-10-CM

## 2018-05-17 DIAGNOSIS — I498 Other specified cardiac arrhythmias: Secondary | ICD-10-CM

## 2018-05-17 DIAGNOSIS — I493 Ventricular premature depolarization: Secondary | ICD-10-CM

## 2018-05-17 DIAGNOSIS — R011 Cardiac murmur, unspecified: Secondary | ICD-10-CM

## 2018-05-17 DIAGNOSIS — I499 Cardiac arrhythmia, unspecified: Secondary | ICD-10-CM

## 2018-05-17 NOTE — Patient Instructions (Addendum)
Medication Instructions:  Your physician recommends that you continue on your current medications as directed. Please refer to the Current Medication list given to you today.  If you need a refill on your cardiac medications before your next appointment, please call your pharmacy.   Lab work: Your physician recommends that you return for lab work today: bmp, bnp   If you have labs (blood work) drawn today and your tests are completely normal, you will receive your results only by: Marland Kitchen MyChart Message (if you have MyChart) OR . A paper copy in the mail If you have any lab test that is abnormal or we need to change your treatment, we will call you to review the results.  Testing/Procedures:   Please arrive at the [facility] main entrance of [facility] at xx:xx AM (30-45 minutes prior to test start time)  [facility] [address] [place], [place] [address] [phone]  Proceed to the [facility] Radiology Department (First Floor).  Please follow these instructions carefully (unless otherwise directed):  Hold all erectile dysfunction medications at least 48 hours prior to test.  On the Night Before the Test: . Be sure to Drink plenty of water. . Do not consume any caffeinated/decaffeinated beverages or chocolate 12 hours prior to your test. . Do not take any antihistamines 12 hours prior to your test. . If you take Metformin do not take 24 hours prior to test.   On the Day of the Test: . Drink plenty of water. Do not drink any water within one hour of the test. . Do not eat any food 4 hours prior to the test. . You may take your regular medications prior to the test.  . Take metoprolol (Lopressor) two hours prior to test. . HOLD Furosemide/Hydrochlorothiazide morning of the test.           -Drink plenty of water       Take your metoprolol as prescribed.                       After the Test: . Drink plenty of water. . After receiving IV  contrast, you may experience a mild flushed feeling. This is normal. . On occasion, you may experience a mild rash up to 24 hours after the test. This is not dangerous. If this occurs, you can take Benadryl 25 mg and increase your fluid intake. . If you experience trouble breathing, this can be serious. If it is severe call 911 IMMEDIATELY. If it is mild, please call our office. . If you take any of these medications: Glipizide/Metformin, Avandament, Glucavance, please do not take 48 hours after completing test.      Follow-Up: At [facility], you and your health needs are our priority.  As part of our continuing mission to provide you with exceptional heart care, we have created designated Provider Care Teams.  These Care Teams include your primary Cardiologist (physician) and Advanced Practice Providers (APPs -  Physician Assistants and Nurse Practitioners) who all work together to provide you with the care you need, when you need it. You will need a follow up appointment in 4 weeks.  Please call our office 2 months in advance to schedule this appointment.  You may see No primary care provider on file. or another member of our [employer] Provider Team in [place]: Gypsy Balsam, MD . Norman Herrlich, MD  Any Other Special Instructions Will Be Listed Below (If Applicable).  Cardiac CT Angiogram A cardiac CT angiogram is a procedure to look at the heart and the area around the heart. It may be done to help find the cause of chest pains or other symptoms of heart disease. During this procedure, a large X-ray machine, called a CT scanner, takes detailed pictures of the heart and the surrounding area after a dye (contrast material) has been injected into blood vessels in the area. The procedure is also sometimes called a coronary CT angiogram, coronary artery scanning, or CTA. A cardiac CT angiogram allows the health care provider to see how well blood is flowing to and from the heart.  The health care provider will be able to see if there are any problems, such as:  Blockage or narrowing of the coronary arteries in the heart.  Fluid around the heart.  Signs of weakness or disease in the muscles, valves, and tissues of the heart.  Tell a health care provider about:  Any allergies you have. This is especially important if you have had a previous allergic reaction to contrast dye.  All medicines you are taking, including vitamins, herbs, eye drops, creams, and over-the-counter medicines.  Any blood disorders you have.  Any surgeries you have had.  Any medical conditions you have.  Whether you are pregnant or may be pregnant.  Any anxiety disorders, chronic pain, or other conditions you have that may increase your stress or prevent you from lying still. What are the risks? Generally, this is a safe procedure. However, problems may occur, including:  Bleeding.  Infection.  Allergic reactions to medicines or dyes.  Damage to other structures or organs.  Kidney damage from the dye or contrast that is used.  Increased risk of cancer from radiation exposure. This risk is low. Talk with your health care provider about: ? The risks and benefits of testing. ? How you can receive the lowest dose of radiation.  What happens before the procedure?  Wear comfortable clothing and remove any jewelry, glasses, dentures, and hearing aids.  Follow instructions from your health care provider about eating and drinking. This may include: ? For 12 hours before the test - avoid caffeine. This includes tea, coffee, soda, energy drinks, and diet pills. Drink plenty of water or other fluids that do not have caffeine in them. Being well-hydrated can prevent complications. ? For 4-6 hours before the test - stop eating and drinking. The contrast dye can cause nausea, but this is less likely if your stomach is empty.  Ask your health care provider about changing or stopping your  regular medicines. This is especially important if you are taking diabetes medicines, blood thinners, or medicines to treat erectile dysfunction. What happens during the procedure?  Hair on your chest may need to be removed so that small sticky patches called electrodes can be placed on your chest. These will transmit information that helps to monitor your heart during the test.  An IV tube will be inserted into one of your veins.  You might be given a medicine to control your heart rate during the test. This will help to ensure that good images are obtained.  You will be asked to lie on an exam table. This table will slide in and out of the CT machine during the procedure.  Contrast dye will be injected into the IV tube. You might feel warm, or you may get a metallic taste in your mouth.  You will be given a medicine (nitroglycerin) to relax (dilate)  the arteries in your heart.  The table that you are lying on will move into the CT machine tunnel for the scan.  The person running the machine will give you instructions while the scans are being done. You may be asked to: ? Keep your arms above your head. ? Hold your breath. ? Stay very still, even if the table is moving.  When the scanning is complete, you will be moved out of the machine.  The IV tube will be removed. The procedure may vary among health care providers and hospitals. What happens after the procedure?  You might feel warm, or you may get a metallic taste in your mouth from the contrast dye.  You may have a headache from the nitroglycerin.  After the procedure, drink water or other fluids to wash (flush) the contrast material out of your body.  Contact a health care provider if you have any symptoms of allergy to the contrast. These symptoms include: ? Shortness of breath. ? Rash or hives. ? A racing heartbeat.  Most people can return to their normal activities right after the procedure. Ask your health care  provider what activities are safe for you.  It is up to you to get the results of your procedure. Ask your health care provider, or the department that is doing the procedure, when your results will be ready. Summary  A cardiac CT angiogram is a procedure to look at the heart and the area around the heart. It may be done to help find the cause of chest pains or other symptoms of heart disease.  During this procedure, a large X-ray machine, called a CT scanner, takes detailed pictures of the heart and the surrounding area after a dye (contrast material) has been injected into blood vessels in the area.  Ask your health care provider about changing or stopping your regular medicines before the procedure. This is especially important if you are taking diabetes medicines, blood thinners, or medicines to treat erectile dysfunction.  After the procedure, drink water or other fluids to wash (flush) the contrast material out of your body. This information is not intended to replace advice given to you by your health care provider. Make sure you discuss any questions you have with your health care provider. Document Released: 05/15/2008 Document Revised: 04/21/2016 Document Reviewed: 04/21/2016 Elsevier Interactive Patient Education  2017 [employer].

## 2018-05-17 NOTE — Progress Notes (Signed)
Stress echocardiogram has been performed.  Jimmy Frazier Balfour RDCS, RVT 

## 2018-05-18 LAB — BASIC METABOLIC PANEL
BUN / CREAT RATIO: 13 (ref 9–23)
BUN: 9 mg/dL (ref 6–20)
CHLORIDE: 104 mmol/L (ref 96–106)
CO2: 22 mmol/L (ref 20–29)
Calcium: 9.3 mg/dL (ref 8.7–10.2)
Creatinine, Ser: 0.71 mg/dL (ref 0.57–1.00)
GFR, EST AFRICAN AMERICAN: 127 mL/min/{1.73_m2} (ref >59)
GFR, EST NON AFRICAN AMERICAN: 110 mL/min/{1.73_m2} (ref >59)
Glucose: 84 mg/dL (ref 65–99)
POTASSIUM: 3.5 mmol/L (ref 3.5–5.2)
SODIUM: 140 mmol/L (ref 134–144)

## 2018-05-18 LAB — PRO B NATRIURETIC PEPTIDE: NT-Pro BNP: 96 pg/mL (ref 0–130)

## 2018-06-17 ENCOUNTER — Ambulatory Visit: Payer: Medicaid Other | Admitting: Cardiology

## 2018-06-25 ENCOUNTER — Telehealth: Payer: Self-pay | Admitting: *Deleted

## 2018-06-25 NOTE — Telephone Encounter (Signed)
Contacted patient to reschedule follow up appointment on 06/28/2018 until after cardiac CTA is completed on 07/08/2018. Patient will see Dr. Tomie China on Friday, 07/09/2018 at 9:20 am in the [place] office to discuss results.   Patient reminded to have lab work drawn prior to caridac CTA. Patient states she will return to the [place] office on Tuesday, 07/06/2018 to have BMP drawn, no appointment needed. Patient verbalized understanding. No further questions.

## 2018-06-28 ENCOUNTER — Ambulatory Visit: Payer: Medicaid Other | Admitting: Cardiology

## 2018-07-06 LAB — BASIC METABOLIC PANEL
BUN/Creatinine Ratio: 13 (ref 9–23)
BUN: 10 mg/dL (ref 6–20)
CO2: 21 mmol/L (ref 20–29)
Calcium: 9.3 mg/dL (ref 8.7–10.2)
Chloride: 103 mmol/L (ref 96–106)
Creatinine, Ser: 0.75 mg/dL (ref 0.57–1.00)
GFR calc Af Amer: 119 mL/min/{1.73_m2} (ref >59)
GFR calc non Af Amer: 103 mL/min/{1.73_m2} (ref >59)
Glucose: 84 mg/dL (ref 65–99)
Potassium: 3.9 mmol/L (ref 3.5–5.2)
SODIUM: 138 mmol/L (ref 134–144)

## 2018-07-07 ENCOUNTER — Telehealth (HOSPITAL_COMMUNITY): Payer: Self-pay | Admitting: Emergency Medicine

## 2018-07-07 NOTE — Telephone Encounter (Signed)
Left message on voicemail with name and callback number Devlin Brink RN Navigator Cardiac Imaging [facility] [phone] Office [phone] Cell  

## 2018-07-07 NOTE — Telephone Encounter (Signed)
Pt returning phone call. pt verbalizes understanding of appt date/time, parking situation and where to check in, pre-test NPO status and medications ordered, and verified current allergies; name and call back number provided for further questions should they arise Lawan Nanez RN Navigator Cardiac Imaging [facility] [phone] office [phone] cell 

## 2018-07-08 ENCOUNTER — Ambulatory Visit (HOSPITAL_COMMUNITY)
Admission: RE | Admit: 2018-07-08 | Discharge: 2018-07-08 | Disposition: A | Discharge status: Home / Self Care | Payer: Medicaid Other | Source: Ambulatory Visit | Attending: Cardiology | Admitting: Cardiology

## 2018-07-08 ENCOUNTER — Ambulatory Visit (HOSPITAL_COMMUNITY): Admission: RE | Admit: 2018-07-08 | Payer: Medicaid Other | Source: Ambulatory Visit

## 2018-07-08 DIAGNOSIS — R9439 Abnormal result of other cardiovascular function study: Secondary | ICD-10-CM | POA: Insufficient documentation

## 2018-07-08 DIAGNOSIS — R9430 Abnormal result of cardiovascular function study, unspecified: Secondary | ICD-10-CM

## 2018-07-08 MED ORDER — IOHEXOL 300 MG/ML  SOLN
100.0000 mL | Freq: Once | INTRAMUSCULAR | Status: AC | PRN
  Administered 2018-07-08: 100 mL via INTRAVENOUS

## 2018-07-08 MED ORDER — NITROGLYCERIN 0.4 MG SL SUBL
0.8000 mg | SUBLINGUAL_TABLET | Freq: Once | SUBLINGUAL | Status: AC
  Administered 2018-07-08: 0.8 mg via SUBLINGUAL

## 2018-07-08 MED ORDER — NITROGLYCERIN 0.4 MG SL SUBL
SUBLINGUAL_TABLET | SUBLINGUAL | Status: AC
  Administered 2018-07-08: 0.8 mg via SUBLINGUAL
  Filled 2018-07-08: qty 2

## 2018-07-08 MED ORDER — METOPROLOL TARTRATE 5 MG/5ML IV SOLN
5.0000 mg | INTRAVENOUS | Status: DC | PRN
  Administered 2018-07-08: 5 mg via INTRAVENOUS

## 2018-07-08 MED ORDER — METOPROLOL TARTRATE 5 MG/5ML IV SOLN
INTRAVENOUS | Status: AC
  Administered 2018-07-08: 5 mg via INTRAVENOUS
  Filled 2018-07-08: qty 10

## 2018-07-08 NOTE — Sedation Documentation (Signed)
Pt required a 5mg  IV metoprolol dose after the nitro 0.8mg  SL dose was given.  HR went from low 60's to 90's.  HR decreased to low 60's after metoprolol given (63).  Pt tolerated exam without further incident.  PIV remains patent and intact.  BP stable after CT scan completed (BP taken in seated position).  Pt transferred from CT table to wheelchair with standby assist, denies dizziness or lightheadedness.   On arrival back to IR holding, pt transferred from wheelchair to chair with stanby assist.  BP in sitting position 96/69.  Pt denies dizziness, headache or lightheadedness.  Coke and crackers given to patient.  Will cont to monitor.

## 2018-07-08 NOTE — Progress Notes (Signed)
Pt HR increased after nitro dose administered (see MAR).  Metoprolol IV given per standing protocols.  Pt tolerated CT scan without further incident.  PIV remains intact and patent, saline locked post CT scan.  Pt transferred to to [facility] with standby assist, denied any dizziness or lightheadedness.  Pt transferred from [facility] to chair in IR holding with standby assist.  Denied dizziness and lightheadedness.  BP 96/69 at 1520.  Pt given coke and crackers.  Will cont to monitor

## 2018-07-08 NOTE — Progress Notes (Signed)
BP sitting and standing is stable (see VS flowsheet).  Pt denies any dizziness, lightheadedness, headache.  Denies CP or SOB.  Pt denies any pain.  PIV removed.  Pt discharged from IR holding, ambulated to family waiting room without issue

## 2018-07-09 ENCOUNTER — Ambulatory Visit (INDEPENDENT_AMBULATORY_CARE_PROVIDER_SITE_OTHER): Payer: Medicaid Other | Admitting: Cardiology

## 2018-07-09 ENCOUNTER — Encounter: Payer: Self-pay | Admitting: Cardiology

## 2018-07-09 VITALS — BP 103/58 | HR 59 | Ht 67.0 in | Wt 117.0 lb

## 2018-07-09 DIAGNOSIS — I499 Cardiac arrhythmia, unspecified: Secondary | ICD-10-CM

## 2018-07-09 DIAGNOSIS — I498 Other specified cardiac arrhythmias: Secondary | ICD-10-CM

## 2018-07-09 NOTE — Patient Instructions (Signed)
Medication Instructions:  Your physician has recommended you make the following change in your medication:  -STOP taking Metoprolol   If you need a refill on your cardiac medications before your next appointment, please call your pharmacy.   Lab work:  If you have labs (blood work) drawn today and your tests are completely normal, you will receive your results only by: Marland Kitchen. MyChart Message (if you have MyChart) OR . A paper copy in the mail If you have any lab test that is abnormal or we need to change your treatment, we will call you to review the results.  Testing/Procedures: None   Follow-Up: At [facility], you and your health needs are our priority.  As part of our continuing mission to provide you with exceptional heart care, we have created designated Provider Care Teams.  These Care Teams include your primary Cardiologist (physician) and Advanced Practice Providers (APPs -  Physician Assistants and Nurse Practitioners) who all work together to provide you with the care you need, when you need it. You will need a follow up appointment in 9 months.  Please call our office 2 months in advance to schedule this appointment.  You may see No primary care provider on file. or another member of our [facility] Provider Team in [place]: Gypsy Balsamobert Krasowski, MD . Norman HerrlichBrian Munley, MD  Any Other Special Instructions Will Be Listed Below (If Applicable).

## 2018-07-09 NOTE — Progress Notes (Signed)
Cardiology Office Note:    Date:  07/09/2018   ID:  Nichole Mullins, DOB 07-Apr-1981, MRN [id number]  PCP:  Retia Passe, NP  Cardiologist:  Garwin Brothers, MD   Referring MD: Retia Passe, NP    ASSESSMENT:    1. Ventricular bigeminy    PLAN:    In order of problems listed above:  1. Primary prevention stressed with the patient.  Importance of compliance with diet and medication stressed and he vocalized understanding.  Her blood pressure is stable.  Diet was discussed. 2. CT coronary angiography report was discussed with the patient at extensive length.  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  I reviewed echocardiogram done from several months ago and was unremarkable.  Patient complains of symptoms of postural hypotension and dizziness at times therefore I have discontinued metoprolol and asked her to keep herself well-hydrated and add a little extra salt to her diet and call us if the symptoms do not resolve. 3. Patient will be seen in follow-up appointment in 9 months or earlier if the patient has any concerns    Medication Adjustments/Labs and Tests Ordered: Current medicines are reviewed at length with the patient today.  Concerns regarding medicines are outlined above.  No orders of the defined types were placed in this encounter.  No orders of the defined types were placed in this encounter.    No chief complaint on file.    History of Present Illness:    Nichole Mullins is a 38 y.o. female.  Patient was evaluated for ventricular bigeminy.  She also was evaluated for chest pain and underwent CT coronary angiography which was unremarkable.  It revealed mild pulmonary dilation.  Patient denies any problems at this time care of activities of daily living.  No chest pain orthopnea or PND.  Past Medical History:  Diagnosis Date  . Asthma   . COPD (chronic obstructive pulmonary disease) (HCC)     Past Surgical History:  Procedure Laterality Date  . NO PAST  SURGERIES      Current Medications: Current Meds  Medication Sig  . Cyanocobalamin (VITAMIN B-12 IJ) Inject 1,000 mcg as directed. Inj is once a month  . montelukast (SINGULAIR) 10 MG tablet Take 10 mg by mouth daily.  Marland Kitchen omeprazole (PRILOSEC) 20 MG capsule Take 20 mg by mouth daily.  Marland Kitchen rOPINIRole (REQUIP) 0.5 MG tablet Take 2 tablets by mouth Nightly.  . Vitamin D, Ergocalciferol, (DRISDOL) 1.25 MG (50000 UT) CAPS capsule Take 1 capsule by mouth once a week.     Allergies:   Ciprofloxacin   Social History   Socioeconomic History  . Marital status: Married    Spouse name: Not on file  . Number of children: Not on file  . Years of education: Not on file  . Highest education level: Not on file  Occupational History  . Not on file  Social Needs  . Financial resource strain: Not on file  . Food insecurity:    Worry: Not on file    Inability: Not on file  . Transportation needs:    Medical: Not on file    Non-medical: Not on file  Tobacco Use  . Smoking status: Former Games developer  . Smokeless tobacco: Never Used  Substance and Sexual Activity  . Alcohol use: Not on file  . Drug use: Not on file  . Sexual activity: Not on file  Lifestyle  . Physical activity:    Days per week: Not on  file    Minutes per session: Not on file  . Stress: Not on file  Relationships  . Social connections:    Talks on phone: Not on file    Gets together: Not on file    Attends religious service: Not on file    Active member of club or organization: Not on file    Attends meetings of clubs or organizations: Not on file    Relationship status: Not on file  Other Topics Concern  . Not on file  Social History Narrative  . Not on file     Family History: The patient's family history includes Heart disease in her mother; Lung cancer in her father.  ROS:   Please see the history of present illness.    All other systems reviewed and are negative.  EKGs/Labs/Other Studies Reviewed:    The  following studies were reviewed today: I discussed my findings of the coronary CT angiography with the patient at extensive length.   Recent Labs: 05/17/2018: NT-Pro BNP 96 07/06/2018: BUN 10; Creatinine, Ser 0.75; Potassium 3.9; Sodium 138  Recent Lipid Panel No results found for: CHOL, TRIG, HDL, CHOLHDL, VLDL, LDLCALC, LDLDIRECT  Physical Exam:    VS:  BP (!) 103/58 (BP Location: Right Arm, Patient Position: Sitting, Cuff Size: Normal)   Pulse (!) 59   Ht 5\' 7"  (1.702 m)   Wt 117 lb (53.1 kg)   SpO2 100%   BMI 18.32 kg/m     Wt Readings from Last 3 Encounters:  07/09/18 117 lb (53.1 kg)  05/07/18 115 lb (52.2 kg)  01/05/18 114 lb (51.7 kg)     GEN: Patient is in no acute distress HEENT: Normal NECK: No JVD; No carotid bruits LYMPHATICS: No lymphadenopathy CARDIAC: Hear sounds regular, 2/6 systolic murmur at the apex. RESPIRATORY:  Clear to auscultation without rales, wheezing or rhonchi  ABDOMEN: Soft, non-tender, non-distended MUSCULOSKELETAL:  No edema; No deformity  SKIN: Warm and dry NEUROLOGIC:  Alert and oriented x 3 PSYCHIATRIC:  Normal affect   Signed, Garwin Brothers, MD  07/09/2018 9:55 AM    [facility]

## 2018-07-09 NOTE — Addendum Note (Signed)
Addended by: Rodney Langton on: 07/09/2018 10:01 AM   Modules accepted: Orders

## 2023-05-06 ENCOUNTER — Other Ambulatory Visit: Payer: Self-pay | Admitting: Student

## 2023-05-06 DIAGNOSIS — Z1231 Encounter for screening mammogram for malignant neoplasm of breast: Secondary | ICD-10-CM

## 2023-05-12 ENCOUNTER — Ambulatory Visit
Admission: RE | Admit: 2023-05-12 | Discharge: 2023-05-12 | Disposition: A | Discharge status: Home / Self Care | Payer: Commercial Managed Care - HMO | Source: Ambulatory Visit | Attending: Family | Admitting: Family

## 2023-05-12 DIAGNOSIS — Z1231 Encounter for screening mammogram for malignant neoplasm of breast: Secondary | ICD-10-CM

## 2024-05-20 ENCOUNTER — Other Ambulatory Visit: Payer: Self-pay | Admitting: Nurse Practitioner

## 2024-05-20 DIAGNOSIS — Z1231 Encounter for screening mammogram for malignant neoplasm of breast: Secondary | ICD-10-CM

## 2024-05-24 ENCOUNTER — Inpatient Hospital Stay
Admission: RE | Admit: 2024-05-24 | Discharge: 2024-05-24 | Payer: Commercial Managed Care - HMO | Attending: Family | Admitting: Family

## 2024-05-24 ENCOUNTER — Other Ambulatory Visit: Payer: Self-pay | Admitting: Family

## 2024-05-24 DIAGNOSIS — Z1231 Encounter for screening mammogram for malignant neoplasm of breast: Secondary | ICD-10-CM

## 2024-05-31 ENCOUNTER — Other Ambulatory Visit: Payer: Self-pay | Admitting: Family

## 2024-05-31 DIAGNOSIS — R928 Other abnormal and inconclusive findings on diagnostic imaging of breast: Secondary | ICD-10-CM

## 2024-06-15 ENCOUNTER — Ambulatory Visit
Admission: RE | Admit: 2024-06-15 | Discharge: 2024-06-15 | Disposition: A | Discharge status: Home / Self Care | Source: Ambulatory Visit | Attending: Family | Admitting: Family

## 2024-06-15 DIAGNOSIS — R928 Other abnormal and inconclusive findings on diagnostic imaging of breast: Secondary | ICD-10-CM
